# Patient Record
Sex: Male | Born: 1945 | Race: White | Hispanic: No | Marital: Married | Smoking: Former smoker
Health system: Southern US, Community
[De-identification: ages and names within clinical notes are randomized; demographics above are authoritative.]

## PROBLEM LIST (undated history)

## (undated) DIAGNOSIS — I10 Essential (primary) hypertension: Secondary | ICD-10-CM

## (undated) DIAGNOSIS — C801 Malignant (primary) neoplasm, unspecified: Secondary | ICD-10-CM

## (undated) DIAGNOSIS — M549 Dorsalgia, unspecified: Secondary | ICD-10-CM

## (undated) DIAGNOSIS — K219 Gastro-esophageal reflux disease without esophagitis: Secondary | ICD-10-CM

## (undated) DIAGNOSIS — I4891 Unspecified atrial fibrillation: Secondary | ICD-10-CM

## (undated) DIAGNOSIS — G629 Polyneuropathy, unspecified: Secondary | ICD-10-CM

## (undated) HISTORY — PX: TONSILLECTOMY: SUR1361

## (undated) HISTORY — PX: PROSTATE SURGERY: SHX751

---

## 2000-09-05 ENCOUNTER — Other Ambulatory Visit: Admission: RE | Admit: 2000-09-05 | Discharge: 2000-09-05 | Payer: Self-pay | Admitting: Urology

## 2001-05-13 ENCOUNTER — Encounter: Payer: Self-pay | Admitting: Urology

## 2001-05-20 ENCOUNTER — Inpatient Hospital Stay (HOSPITAL_COMMUNITY): Admission: RE | Admit: 2001-05-20 | Discharge: 2001-05-23 | Payer: Self-pay | Admitting: Urology

## 2004-09-15 ENCOUNTER — Emergency Department: Payer: Self-pay | Admitting: Unknown Physician Specialty

## 2004-09-15 ENCOUNTER — Other Ambulatory Visit: Payer: Self-pay

## 2005-05-22 ENCOUNTER — Ambulatory Visit: Payer: Self-pay | Admitting: Oncology

## 2005-05-25 ENCOUNTER — Ambulatory Visit: Payer: Self-pay | Admitting: Oncology

## 2005-05-25 ENCOUNTER — Other Ambulatory Visit: Payer: Self-pay

## 2005-05-25 ENCOUNTER — Emergency Department: Payer: Self-pay | Admitting: Internal Medicine

## 2005-06-17 ENCOUNTER — Ambulatory Visit: Payer: Self-pay | Admitting: Oncology

## 2005-07-17 ENCOUNTER — Ambulatory Visit: Payer: Self-pay | Admitting: Oncology

## 2005-11-30 ENCOUNTER — Ambulatory Visit: Payer: Self-pay | Admitting: Oncology

## 2005-12-17 ENCOUNTER — Ambulatory Visit: Payer: Self-pay | Admitting: Oncology

## 2006-06-18 ENCOUNTER — Ambulatory Visit: Payer: Self-pay | Admitting: Oncology

## 2006-06-20 ENCOUNTER — Ambulatory Visit: Payer: Self-pay | Admitting: Oncology

## 2006-07-18 ENCOUNTER — Ambulatory Visit: Payer: Self-pay | Admitting: Oncology

## 2007-01-18 ENCOUNTER — Ambulatory Visit: Payer: Self-pay | Admitting: Oncology

## 2007-01-24 ENCOUNTER — Ambulatory Visit: Payer: Self-pay | Admitting: Oncology

## 2007-02-17 ENCOUNTER — Ambulatory Visit: Payer: Self-pay | Admitting: Oncology

## 2007-07-18 ENCOUNTER — Ambulatory Visit: Payer: Self-pay | Admitting: Oncology

## 2007-07-23 ENCOUNTER — Ambulatory Visit: Payer: Self-pay | Admitting: Oncology

## 2007-08-18 ENCOUNTER — Ambulatory Visit: Payer: Self-pay | Admitting: Oncology

## 2008-08-17 ENCOUNTER — Ambulatory Visit: Payer: Self-pay | Admitting: Oncology

## 2008-08-23 ENCOUNTER — Ambulatory Visit: Payer: Self-pay | Admitting: Oncology

## 2008-09-16 ENCOUNTER — Ambulatory Visit: Payer: Self-pay | Admitting: Oncology

## 2009-02-16 ENCOUNTER — Ambulatory Visit: Payer: Self-pay | Admitting: Oncology

## 2009-02-23 ENCOUNTER — Ambulatory Visit: Payer: Self-pay | Admitting: Oncology

## 2009-03-19 ENCOUNTER — Ambulatory Visit: Payer: Self-pay | Admitting: Oncology

## 2009-10-17 ENCOUNTER — Ambulatory Visit: Payer: Self-pay | Admitting: Gastroenterology

## 2011-03-07 ENCOUNTER — Ambulatory Visit: Payer: Self-pay | Admitting: Family Medicine

## 2011-05-08 ENCOUNTER — Emergency Department: Payer: Self-pay | Admitting: *Deleted

## 2013-05-21 ENCOUNTER — Ambulatory Visit: Payer: Self-pay | Admitting: Otolaryngology

## 2014-04-04 ENCOUNTER — Emergency Department (HOSPITAL_COMMUNITY): Payer: BLUE CROSS/BLUE SHIELD

## 2014-04-04 ENCOUNTER — Emergency Department (HOSPITAL_COMMUNITY)
Admission: EM | Admit: 2014-04-04 | Discharge: 2014-04-04 | Disposition: A | Discharge status: Home / Self Care | Payer: BLUE CROSS/BLUE SHIELD | Attending: Emergency Medicine | Admitting: Emergency Medicine

## 2014-04-04 ENCOUNTER — Encounter (HOSPITAL_COMMUNITY): Payer: Self-pay

## 2014-04-04 DIAGNOSIS — R079 Chest pain, unspecified: Secondary | ICD-10-CM

## 2014-04-04 DIAGNOSIS — Z8546 Personal history of malignant neoplasm of prostate: Secondary | ICD-10-CM | POA: Diagnosis not present

## 2014-04-04 DIAGNOSIS — R0789 Other chest pain: Secondary | ICD-10-CM

## 2014-04-04 DIAGNOSIS — Z8669 Personal history of other diseases of the nervous system and sense organs: Secondary | ICD-10-CM | POA: Diagnosis not present

## 2014-04-04 DIAGNOSIS — Z8719 Personal history of other diseases of the digestive system: Secondary | ICD-10-CM | POA: Insufficient documentation

## 2014-04-04 DIAGNOSIS — I10 Essential (primary) hypertension: Secondary | ICD-10-CM | POA: Diagnosis not present

## 2014-04-04 DIAGNOSIS — Z87891 Personal history of nicotine dependence: Secondary | ICD-10-CM | POA: Diagnosis not present

## 2014-04-04 HISTORY — DX: Malignant (primary) neoplasm, unspecified: C80.1

## 2014-04-04 HISTORY — DX: Dorsalgia, unspecified: M54.9

## 2014-04-04 HISTORY — DX: Gastro-esophageal reflux disease without esophagitis: K21.9

## 2014-04-04 HISTORY — DX: Polyneuropathy, unspecified: G62.9

## 2014-04-04 HISTORY — DX: Unspecified atrial fibrillation: I48.91

## 2014-04-04 HISTORY — DX: Essential (primary) hypertension: I10

## 2014-04-04 LAB — BASIC METABOLIC PANEL
ANION GAP: 8 (ref 5–15)
BUN: 11 mg/dL (ref 6–23)
CO2: 28 mmol/L (ref 19–32)
CREATININE: 0.91 mg/dL (ref 0.50–1.35)
Calcium: 9.2 mg/dL (ref 8.4–10.5)
Chloride: 102 mEq/L (ref 96–112)
GFR calc non Af Amer: 86 mL/min — ABNORMAL LOW (ref >90)
GLUCOSE: 133 mg/dL — AB (ref 70–99)
POTASSIUM: 3.9 mmol/L (ref 3.5–5.1)
SODIUM: 138 mmol/L (ref 135–145)

## 2014-04-04 LAB — CBC
HCT: 41.7 % (ref 39.0–52.0)
Hemoglobin: 15.1 g/dL (ref 13.0–17.0)
MCH: 31.7 pg (ref 26.0–34.0)
MCHC: 36.2 g/dL — ABNORMAL HIGH (ref 30.0–36.0)
MCV: 87.6 fL (ref 78.0–100.0)
PLATELETS: 208 10*3/uL (ref 150–400)
RBC: 4.76 MIL/uL (ref 4.22–5.81)
RDW: 12.9 % (ref 11.5–15.5)
WBC: 7.7 10*3/uL (ref 4.0–10.5)

## 2014-04-04 LAB — I-STAT TROPONIN, ED: Troponin i, poc: 0 ng/mL (ref 0.00–0.08)

## 2014-04-04 MED ORDER — HYDROCODONE-ACETAMINOPHEN 5-325 MG PO TABS: 1.0000 | ORAL_TABLET | Freq: Four times a day (QID) | ORAL | Status: DC | PRN

## 2014-04-04 MED ORDER — HYDROCODONE-ACETAMINOPHEN 5-325 MG PO TABS
2.0000 | ORAL_TABLET | Freq: Once | ORAL | Status: AC
  Administered 2014-04-04: 1 via ORAL
  Filled 2014-04-04: qty 2

## 2014-04-04 NOTE — Discharge Instructions (Signed)
Chest Wall Pain Take Tylenol for mild pain or the pain medicine prescribed for bad pain. It is okay to use Biofreeze on the painful areas as well. Contact your primary care physician to arrange to be seen if not feeling better in 3 or 4 days. Return if your condition worsens for any reason Chest wall pain is pain in or around the bones and muscles of your chest. It may take up to 6 weeks to get better. It may take longer if you must stay physically active in your work and activities.  CAUSES  Chest wall pain may happen on its own. However, it may be caused by:  A viral illness like the flu.  Injury.  Coughing.  Exercise.  Arthritis.  Fibromyalgia.  Shingles. HOME CARE INSTRUCTIONS   Avoid overtiring physical activity. Try not to strain or perform activities that cause pain. This includes any activities using your chest or your abdominal and side muscles, especially if heavy weights are used.  Put ice on the sore area.  Put ice in a plastic bag.  Place a towel between your skin and the bag.  Leave the ice on for 15-20 minutes per hour while awake for the first 2 days.  Only take over-the-counter or prescription medicines for pain, discomfort, or fever as directed by your caregiver. SEEK IMMEDIATE MEDICAL CARE IF:   Your pain increases, or you are very uncomfortable.  You have a fever.  Your chest pain becomes worse.  You have new, unexplained symptoms.  You have nausea or vomiting.  You feel sweaty or lightheaded.  You have a cough with phlegm (sputum), or you cough up blood. MAKE SURE YOU:   Understand these instructions.  Will watch your condition.  Will get help right away if you are not doing well or get worse. Document Released: 03/05/2005 Document Revised: 05/28/2011 Document Reviewed: 10/30/2010 [facility] Patient Information 2015 [place], [place]. This information is not intended to replace advice given to you by your health care provider. Make sure you  discuss any questions you have with your health care provider.

## 2014-04-04 NOTE — ED Notes (Signed)
Pt. Reports midsternal chest pain radiation to back with slight nausea and SOB. States pain has been intermittent x2 days.

## 2014-04-04 NOTE — ED Notes (Signed)
Pt c/o centralized chest pain radiating into back starting two days ago. Pain is intermittent and increased with movement. Pt reports pain feels like a squeezing pain that "takes my breath." Pt denies chest pain at this time; reports feeling "bruised and sore." Denies recent injury to chest area

## 2014-04-04 NOTE — ED Provider Notes (Signed)
CSN: [id number]     Arrival date & time 04/04/14  2028 History   First MD Initiated Contact with Patient 04/04/14 2138     Chief Complaint  Patient presents with  . Chest Pain     (Consider location/radiation/quality/duration/timing/severity/associated sxs/prior Treatment) HPI Complains of anterior chest pain rating to the back worse caused by changing positions onset 2 days ago he only has pain with changing positions he has no pain with remaining still. Discomfort is nonexertional. He denies shortness of breath denies nausea. Pain feels like a muscle pull no other associated symptoms. No fever no cough no shortness of breath no other associated symptoms no treatment prior to coming here. Pain is severe with changing positions no pain with remaining still. His wife treated him with topical Biofreeze with relief . Past Medical History  Diagnosis Date  . Hypertension   . Cancer     prostate  . A-fib   . GERD (gastroesophageal reflux disease)   . Back pain   . Neuropathy    prostate cancer greater than 10 years ago Past Surgical History  Procedure Laterality Date  . Prostate surgery    . Tonsillectomy      No family history on file. History  Substance Use Topics  . Smoking status: Former Research scientist (life sciences)  . Smokeless tobacco: Not on file  . Alcohol Use: No    Review of Systems  Constitutional: Negative.   HENT: Negative.   Respiratory: Negative.   Cardiovascular: Positive for chest pain.  Gastrointestinal: Negative.   Musculoskeletal: Negative.   Skin: Negative.   Neurological: Negative.   Psychiatric/Behavioral: Negative.   All other systems reviewed and are negative.     Allergies  Contrast media; Sulfa antibiotics; and Tetracyclines & related  Home Medications   Prior to Admission medications   Not on File   BP 128/87 mmHg  Pulse 74  Temp(Src) 97.6 F (36.4 C) (Oral)  Resp 18  SpO2 98% Physical Exam  ED Course  Procedures (including critical care time) Labs  Review Labs Reviewed  CBC - Abnormal; Notable for the following:    MCHC 36.2 (*)    All other components within normal limits  BASIC METABOLIC PANEL - Abnormal; Notable for the following:    Glucose, Bld 133 (*)    GFR calc non Af Amer 86 (*)    All other components within normal limits  I-STAT TROPOININ, ED    Imaging Review No results found.   EKG Interpretation   Date/Time:  Sunday April 04 2014 20:40:00 EST Ventricular Rate:  87 PR Interval:  152 QRS Duration: 88 QT Interval:  394 QTC Calculation: 474 R Axis:   62 Text Interpretation:  Normal sinus rhythm Normal ECG No significant change  since last tracing Confirmed by Winfred Leeds  MD, Ameliah Baskins [phone]) on 04/04/2014  9:44:23 PM     chest x-ray viewed by me  Results for orders placed or performed during the hospital encounter of 04/04/14  CBC  Result Value Ref Range   WBC 7.7 4.0 - 10.5 K/uL   RBC 4.76 4.22 - 5.81 MIL/uL   Hemoglobin 15.1 13.0 - 17.0 g/dL   HCT 41.7 39.0 - 52.0 %   MCV 87.6 78.0 - 100.0 fL   MCH 31.7 26.0 - 34.0 pg   MCHC 36.2 (H) 30.0 - 36.0 g/dL   RDW 12.9 11.5 - 15.5 %   Platelets 208 150 - 400 K/uL  Basic metabolic panel  Result Value Ref Range   Sodium  138 135 - 145 mmol/L   Potassium 3.9 3.5 - 5.1 mmol/L   Chloride 102 96 - 112 mEq/L   CO2 28 19 - 32 mmol/L   Glucose, Bld 133 (H) 70 - 99 mg/dL   BUN 11 6 - 23 mg/dL   Creatinine, Ser 0.91 0.50 - 1.35 mg/dL   Calcium 9.2 8.4 - 10.5 mg/dL   GFR calc non Af Amer 86 (L) >90 mL/min   GFR calc Af Amer >90 >90 mL/min   Anion gap 8 5 - 15  I-stat troponin, ED (not at [facility])  Result Value Ref Range   Troponin i, poc 0.00 0.00 - 0.08 ng/mL   Comment 3           Dg Chest 2 View  04/04/2014   CLINICAL DATA:  Centralized chest pain described as tightness. History of atrial fibrillation and hypertension.  EXAM: CHEST  2 VIEW  COMPARISON:  None.  FINDINGS: Cardiomediastinal silhouette is unremarkable. Strandy densities LEFT lung base. The lungs are  otherwise clear without pleural effusions or focal consolidations. Trachea projects midline and there is no pneumothorax. Soft tissue planes and included osseous structures are non-suspicious. Moderate degenerative change of the thoracic spine.  IMPRESSION: LEFT lung base atelectasis/ scarring.   Electronically Signed   By: Elon Alas   On: 04/04/2014 21:45    MDM  Current risk factors hypercholesterolemia, hypertension. Heart score equals 3 based on age and risk factors Final diagnoses:  Chest pain   Strongly doubt acute coronary syndrome with normal EKG, normal troponin, atypical story. Strongly doubt pulmonary embolism. No shortness of breath Pain is most consistent with musculoskeletal chest pain Plan prescription Norco follow-up with primary care physician if significant pain in 3 or 4 days Diagnosis chest wall pain    [name], MD 04/04/14 2221

## 2014-07-28 ENCOUNTER — Other Ambulatory Visit: Payer: Self-pay | Admitting: Dermatology

## 2019-12-03 LAB — EXTERNAL GENERIC LAB PROCEDURE: COLOGUARD: NEGATIVE

## 2020-05-06 ENCOUNTER — Observation Stay (HOSPITAL_COMMUNITY)
Admission: EM | Admit: 2020-05-06 | Discharge: 2020-05-07 | Disposition: A | Discharge status: Home / Self Care | Payer: Medicare Other | Attending: Student | Admitting: Student

## 2020-05-06 ENCOUNTER — Encounter (HOSPITAL_COMMUNITY): Payer: Self-pay | Admitting: *Deleted

## 2020-05-06 ENCOUNTER — Emergency Department (HOSPITAL_COMMUNITY): Payer: Medicare Other

## 2020-05-06 ENCOUNTER — Other Ambulatory Visit: Payer: Self-pay

## 2020-05-06 DIAGNOSIS — I639 Cerebral infarction, unspecified: Secondary | ICD-10-CM

## 2020-05-06 DIAGNOSIS — Z8546 Personal history of malignant neoplasm of prostate: Secondary | ICD-10-CM | POA: Insufficient documentation

## 2020-05-06 DIAGNOSIS — R4781 Slurred speech: Secondary | ICD-10-CM | POA: Diagnosis present

## 2020-05-06 DIAGNOSIS — Z79899 Other long term (current) drug therapy: Secondary | ICD-10-CM | POA: Insufficient documentation

## 2020-05-06 DIAGNOSIS — R2689 Other abnormalities of gait and mobility: Secondary | ICD-10-CM | POA: Diagnosis not present

## 2020-05-06 DIAGNOSIS — G459 Transient cerebral ischemic attack, unspecified: Secondary | ICD-10-CM | POA: Diagnosis not present

## 2020-05-06 DIAGNOSIS — Z20822 Contact with and (suspected) exposure to covid-19: Secondary | ICD-10-CM | POA: Diagnosis not present

## 2020-05-06 DIAGNOSIS — I10 Essential (primary) hypertension: Secondary | ICD-10-CM | POA: Insufficient documentation

## 2020-05-06 DIAGNOSIS — E785 Hyperlipidemia, unspecified: Secondary | ICD-10-CM

## 2020-05-06 DIAGNOSIS — Z87891 Personal history of nicotine dependence: Secondary | ICD-10-CM | POA: Insufficient documentation

## 2020-05-06 DIAGNOSIS — R4701 Aphasia: Secondary | ICD-10-CM | POA: Diagnosis present

## 2020-05-06 DIAGNOSIS — E782 Mixed hyperlipidemia: Secondary | ICD-10-CM

## 2020-05-06 HISTORY — DX: Cerebral infarction, unspecified: I63.9

## 2020-05-06 HISTORY — DX: Transient cerebral ischemic attack, unspecified: G45.9

## 2020-05-06 LAB — APTT: aPTT: 41 seconds — ABNORMAL HIGH (ref 24–36)

## 2020-05-06 LAB — DIFFERENTIAL
Abs Immature Granulocytes: 0.01 10*3/uL (ref 0.00–0.07)
Basophils Absolute: 0.1 10*3/uL (ref 0.0–0.1)
Basophils Relative: 1 %
Eosinophils Absolute: 0.4 10*3/uL (ref 0.0–0.5)
Eosinophils Relative: 5 %
Immature Granulocytes: 0 %
Lymphocytes Relative: 34 %
Lymphs Abs: 3.1 10*3/uL (ref 0.7–4.0)
Monocytes Absolute: 0.8 10*3/uL (ref 0.1–1.0)
Monocytes Relative: 8 %
Neutro Abs: 4.7 10*3/uL (ref 1.7–7.7)
Neutrophils Relative %: 52 %

## 2020-05-06 LAB — COMPREHENSIVE METABOLIC PANEL
ALT: 77 U/L — ABNORMAL HIGH (ref 0–44)
AST: 94 U/L — ABNORMAL HIGH (ref 15–41)
Albumin: 4.7 g/dL (ref 3.5–5.0)
Alkaline Phosphatase: 75 U/L (ref 38–126)
Anion gap: 10 (ref 5–15)
BUN: 14 mg/dL (ref 8–23)
CO2: 28 mmol/L (ref 22–32)
Calcium: 9.6 mg/dL (ref 8.9–10.3)
Chloride: 101 mmol/L (ref 98–111)
Creatinine, Ser: 0.88 mg/dL (ref 0.61–1.24)
GFR, Estimated: >60 mL/min (ref >60)
Glucose, Bld: 110 mg/dL — ABNORMAL HIGH (ref 70–99)
Potassium: 3.8 mmol/L (ref 3.5–5.1)
Sodium: 139 mmol/L (ref 135–145)
Total Bilirubin: 1.2 mg/dL (ref 0.3–1.2)
Total Protein: 8 g/dL (ref 6.5–8.1)

## 2020-05-06 LAB — RAPID URINE DRUG SCREEN, HOSP PERFORMED
Amphetamines: NOT DETECTED
Barbiturates: NOT DETECTED
Benzodiazepines: NOT DETECTED
Cocaine: NOT DETECTED
Opiates: NOT DETECTED
Tetrahydrocannabinol: NOT DETECTED

## 2020-05-06 LAB — CBC
HCT: 42.5 % (ref 39.0–52.0)
Hemoglobin: 14.9 g/dL (ref 13.0–17.0)
MCH: 32.3 pg (ref 26.0–34.0)
MCHC: 35.1 g/dL (ref 30.0–36.0)
MCV: 92.2 fL (ref 80.0–100.0)
Platelets: 217 10*3/uL (ref 150–400)
RBC: 4.61 MIL/uL (ref 4.22–5.81)
RDW: 12.6 % (ref 11.5–15.5)
WBC: 9.1 10*3/uL (ref 4.0–10.5)
nRBC: 0 % (ref 0.0–0.2)

## 2020-05-06 LAB — PROTIME-INR
INR: 1.1 (ref 0.8–1.2)
Prothrombin Time: 13.7 seconds (ref 11.4–15.2)

## 2020-05-06 LAB — URINALYSIS, ROUTINE W REFLEX MICROSCOPIC
Bilirubin Urine: NEGATIVE
Glucose, UA: NEGATIVE mg/dL
Hgb urine dipstick: NEGATIVE
Ketones, ur: NEGATIVE mg/dL
Nitrite: POSITIVE — AB
Protein, ur: NEGATIVE mg/dL
Specific Gravity, Urine: 1.009 (ref 1.005–1.030)
WBC, UA: >50 WBC/hpf — ABNORMAL HIGH (ref 0–5)
pH: 6 (ref 5.0–8.0)

## 2020-05-06 LAB — I-STAT CHEM 8, ED
BUN: 15 mg/dL (ref 8–23)
Calcium, Ion: 1.21 mmol/L (ref 1.15–1.40)
Chloride: 101 mmol/L (ref 98–111)
Creatinine, Ser: 0.8 mg/dL (ref 0.61–1.24)
Glucose, Bld: 108 mg/dL — ABNORMAL HIGH (ref 70–99)
HCT: 43 % (ref 39.0–52.0)
Hemoglobin: 14.6 g/dL (ref 13.0–17.0)
Potassium: 3.8 mmol/L (ref 3.5–5.1)
Sodium: 140 mmol/L (ref 135–145)
TCO2: 28 mmol/L (ref 22–32)

## 2020-05-06 LAB — ETHANOL: Alcohol, Ethyl (B): <10 mg/dL (ref <10)

## 2020-05-06 LAB — RESP PANEL BY RT-PCR (FLU A&B, COVID) ARPGX2
Influenza A by PCR: NEGATIVE
Influenza B by PCR: NEGATIVE
SARS Coronavirus 2 by RT PCR: NEGATIVE

## 2020-05-06 MED ORDER — ATORVASTATIN CALCIUM 10 MG PO TABS
20.0000 mg | ORAL_TABLET | Freq: Every evening | ORAL | Status: DC
Start: 2020-05-07 — End: 2020-05-07

## 2020-05-06 MED ORDER — ACETAMINOPHEN 160 MG/5ML PO SOLN: 650.0000 mg | ORAL | Status: DC | PRN

## 2020-05-06 MED ORDER — ASPIRIN 81 MG PO CHEW
81.0000 mg | CHEWABLE_TABLET | Freq: Every day | ORAL | Status: DC
  Administered 2020-05-07 (×2): 81 mg via ORAL
  Filled 2020-05-06 (×2): qty 1

## 2020-05-06 MED ORDER — ACETAMINOPHEN 650 MG RE SUPP: 650.0000 mg | RECTAL | Status: DC | PRN

## 2020-05-06 MED ORDER — SODIUM CHLORIDE 0.9 % IV BOLUS
500.0000 mL | Freq: Once | INTRAVENOUS | Status: AC
  Administered 2020-05-06: 500 mL via INTRAVENOUS

## 2020-05-06 MED ORDER — ACETAMINOPHEN 325 MG PO TABS: 650.0000 mg | ORAL_TABLET | ORAL | Status: DC | PRN

## 2020-05-06 MED ORDER — SENNOSIDES-DOCUSATE SODIUM 8.6-50 MG PO TABS: 1.0000 | ORAL_TABLET | Freq: Every evening | ORAL | Status: DC | PRN

## 2020-05-06 MED ORDER — SODIUM CHLORIDE 0.9 % IV SOLN
1.0000 g | Freq: Once | INTRAVENOUS | Status: AC
  Administered 2020-05-06: 1 g via INTRAVENOUS
  Filled 2020-05-06: qty 10

## 2020-05-06 MED ORDER — LORAZEPAM 1 MG PO TABS
1.0000 mg | ORAL_TABLET | Freq: Once | ORAL | Status: DC | PRN
  Filled 2020-05-06: qty 1

## 2020-05-06 MED ORDER — ENOXAPARIN SODIUM 40 MG/0.4ML ~~LOC~~ SOLN
40.0000 mg | SUBCUTANEOUS | Status: DC
  Administered 2020-05-07: 40 mg via SUBCUTANEOUS
  Filled 2020-05-06: qty 0.4

## 2020-05-06 MED ORDER — STROKE: EARLY STAGES OF RECOVERY BOOK
Freq: Once | Status: DC
  Filled 2020-05-06: qty 1

## 2020-05-06 NOTE — ED Provider Notes (Signed)
[facility] DEPT Provider Note   CSN: [id number] Arrival date & time: 05/06/20  1813     History Chief Complaint  Patient presents with  . evaluation for ? TIA vs CVA yesterday    Matthew Phelps is a 75 y.o. male history of paroxysmal A. fib not on anticoagulants, GERD, hypertension, neuropathy, prostate cancer, hyperlipidemia.  Patient presents today for evaluation of slurred speech that occurred yesterday.  Around 4 PM yesterday patient reports that he was at the hairdresser when he had a 5-minute episode of difficulty speaking he reports that he cannot find the words to say and when he tried to talk they came out slurred, this gradually resolved and has not reoccurred since around 4 PM yesterday.  Patient reports that this episode of slurred speech occurred shortly after he had been getting his hair washed and was leaning back.  Denies recent illness, fever/chills, headache, vision changes, facial droop, unilateral weakness, numbness/tingling, chest pain, abdominal pain, nausea/vomiting or any additional concerns  HPI     Past Medical History:  Diagnosis Date  . A-fib ([place])   . Back pain   . Cancer [facility])    prostate  . GERD (gastroesophageal reflux disease)   . Hypertension   . Neuropathy     There are no problems to display for this patient.   Past Surgical History:  Procedure Laterality Date  . PROSTATE SURGERY    . TONSILLECTOMY         No family history on file.  Social History   Tobacco Use  . Smoking status: Former Research scientist (life sciences)  . Smokeless tobacco: Never Used  Substance Use Topics  . Alcohol use: No  . Drug use: No    Home Medications Prior to Admission medications   Medication Sig Start Date End Date Taking? Authorizing Provider  atorvastatin (LIPITOR) 20 MG tablet Take 20 mg by mouth every evening. 06/18/19  Yes [provider]  cholecalciferol (VITAMIN D3) 25 MCG (1000 UNIT) tablet Take 1,000 Units by mouth  daily.   Yes [provider]  Multiple Vitamins-Minerals (MULTIVITAMIN ADULTS 50+) TABS Take 1 tablet by mouth daily.   Yes [provider]  vitamin C (ASCORBIC ACID) 500 MG tablet Take 500 mg by mouth daily.   Yes [provider]  HYDROcodone-acetaminophen (NORCO) 5-325 MG per tablet Take 1-2 tablets by mouth every 6 (six) hours as needed. Patient not taking: Reported on 05/06/2020 04/04/14   [name], MD    Allergies    Contrast media [iodinated diagnostic agents], Sulfa antibiotics, and Tetracyclines & related  Review of Systems   Review of Systems Ten systems are reviewed and are negative for acute change except as noted in the HPI  Physical Exam Updated Vital Signs BP 128/85   Pulse 75   Temp 97.6 F (36.4 C) (Oral)   Resp 15   Ht 6\' 4"  (1.93 m)   Wt 93 kg   SpO2 97%   BMI 24.95 kg/m   Physical Exam Constitutional:      General: He is not in acute distress.    Appearance: Normal appearance. He is well-developed. He is not ill-appearing or diaphoretic.  HENT:     Head: Normocephalic and atraumatic.  Eyes:     General: Vision grossly intact. Gaze aligned appropriately.     Pupils: Pupils are equal, round, and reactive to light.  Neck:     Trachea: Trachea and phonation normal.  Pulmonary:     Effort: Pulmonary  effort is normal. No respiratory distress.  Abdominal:     General: There is no distension.     Palpations: Abdomen is soft.     Tenderness: There is no abdominal tenderness. There is no guarding or rebound.  Musculoskeletal:        General: Normal range of motion.     Cervical back: Normal range of motion.  Skin:    General: Skin is warm and dry.  Neurological:     Mental Status: He is alert.     GCS: GCS eye subscore is 4. GCS verbal subscore is 5. GCS motor subscore is 6.     Comments: Speech is clear and goal oriented, follows commands Major Cranial nerves without deficit, no facial droop Normal strength in upper and  lower extremities bilaterally including dorsiflexion and plantar flexion, strong and equal grip strength Sensation normal to light and sharp touch Moves extremities without ataxia, coordination intact Normal finger to nose and rapid alternating movements Neg romberg, no pronator drift Normal gait Normal heel-shin and balance  Psychiatric:        Behavior: Behavior normal.     ED Results / Procedures / Treatments   Labs (all labs ordered are listed, but only abnormal results are displayed) Labs Reviewed  APTT - Abnormal; Notable for the following components:      Result Value   aPTT 41 (*)    All other components within normal limits  COMPREHENSIVE METABOLIC PANEL - Abnormal; Notable for the following components:   Glucose, Bld 110 (*)    AST 94 (*)    ALT 77 (*)    All other components within normal limits  URINALYSIS, ROUTINE W REFLEX MICROSCOPIC - Abnormal; Notable for the following components:   APPearance HAZY (*)    Nitrite POSITIVE (*)    Leukocytes,Ua LARGE (*)    WBC, UA >50 (*)    Bacteria, UA MANY (*)    All other components within normal limits  I-STAT CHEM 8, ED - Abnormal; Notable for the following components:   Glucose, Bld 108 (*)    All other components within normal limits  URINE CULTURE  RESP PANEL BY RT-PCR (FLU A&B, COVID) ARPGX2  PROTIME-INR  CBC  DIFFERENTIAL  RAPID URINE DRUG SCREEN, HOSP PERFORMED  ETHANOL    EKG EKG Interpretation  Date/Time:  Friday May 06 2020 19:07:07 EST Ventricular Rate:  74 PR Interval:    QRS Duration: 97 QT Interval:  414 QTC Calculation: 460 R Axis:   69 Text Interpretation: Sinus rhythm Confirmed by Lennice Sites [phone]) on 05/06/2020 9:48:36 PM   Radiology CT HEAD WO CONTRAST  Result Date: 05/06/2020 CLINICAL DATA:  Recent episode of slurred speech and word-finding difficulty. EXAM: CT HEAD WITHOUT CONTRAST TECHNIQUE: Contiguous axial images were obtained from the base of the skull through the vertex  without intravenous contrast. COMPARISON:  Head CT 07/03/2006 FINDINGS: Brain: There is no mass, hemorrhage or extra-axial collection. The size and configuration of the ventricles and extra-axial CSF spaces are normal. The brain parenchyma is normal, without acute or chronic infarction. Vascular: No abnormal hyperdensity of the major intracranial arteries or dural venous sinuses. No intracranial atherosclerosis. Skull: The visualized skull base, calvarium and extracranial soft tissues are normal. Sinuses/Orbits: No fluid levels or advanced mucosal thickening of the visualized paranasal sinuses. No mastoid or middle ear effusion. The orbits are normal. IMPRESSION: Normal head CT. Electronically Signed   By: Ulyses Jarred M.D.   On: 05/06/2020 22:09  Procedures Procedures   Medications Ordered in ED Medications  cefTRIAXone (ROCEPHIN) 1 g in sodium chloride 0.9 % 100 mL IVPB (1 g Intravenous New Bag/Given 05/06/20 2215)  sodium chloride 0.9 % bolus 500 mL (500 mLs Intravenous New Bag/Given 05/06/20 2209)    ED Course  I have reviewed the triage vital signs and the nursing notes.  Pertinent labs & imaging results that were available during my care of the patient were reviewed by me and considered in my medical decision making (see chart for details).    MDM Rules/Calculators/A&P                         Additional history obtained from: 1. Nursing notes from this visit. 2. EMR review. ------------- I ordered, reviewed and interpreted labs which include: CBC shows no leukocytosis to suggest infection, no anemia. CMP shows mild elevations of AST and ALT, no priors to compare.  No emergent electrolyte derangement, AKI or gap. PT/INR within normal limits. Ethanol negative. Urinalysis shows no evidence for UTI, urine culture pending.  IV Rocephin ordered for treatment UDS negative.  EKG: Sinus rhythm Confirmed by Lennice Sites [phone]) on 05/06/2020 9:48:36 PM  CT Head:  IMPRESSION:  Normal  head CT.  - 10:20 PM: Consult with neurologist Dr. Leonel Ramsay who advises medicine admission at [facility] for TIA work-up. - Patient reassessed he is resting comfortably in bed no acute distress wife at bedside.  Patient updated on findings above and he states understanding, patient does report that has been experiencing some mild dysuria for the past few weeks and is actually scheduled to see his urologist on the 24th of this month.  Doubt patient's UTI caused his aphasia yesterday.  Patient and his wife are agreeable for admission - 10:40 PM: Consult with Dr. Posey Pronto, patient has been accepted to hospitalist service.    Note: Portions of this report may have been transcribed using voice recognition software. Every effort was made to ensure accuracy; however, inadvertent computerized transcription errors may still be present. Final Clinical Impression(s) / ED Diagnoses Final diagnoses:  TIA (transient ischemic attack)    Rx / DC Orders ED Discharge Orders    None       Gari Crown 05/06/20 2243    Lennice Sites, DO 05/06/20 2331

## 2020-05-06 NOTE — ED Triage Notes (Signed)
Yesterday afternoon pt had a brief episode of slurred speech and forming words, no ext weakness.  All symptoms have resolved, family member wants him checked out.

## 2020-05-06 NOTE — H&P (Signed)
History and Physical    Matthew Phelps:[id number] DOB: 16-May-1945 DOA: 05/06/2020  PCP: Ellene Route  Patient coming from: Home  I have personally briefly reviewed patient's old medical records in [place]  Chief Complaint: Transient aphasia  HPI: Matthew Phelps is a 75 y.o. male with medical history significant for remote paroxysmal atrial fibrillation, hyperlipidemia, prostate cancer s/p prostatectomy who presents to the ED for evaluation of transient aphasic episode.  Patient states he was in his usual state of health until around 4 PM on 05/05/2020 while he was at the hairdresser when he had about a 5-minute transient episode of difficulty speaking.  He says he knew what he wanted to say but the speech was very difficult to produce and slurred.  He says his hairdresser did not notice any facial droop.  He did not have any associated headache, change in vision, chest pain, dyspnea, nausea, vomiting, numbness/tingling, or new weakness in his hands, arms, or legs.  He denies any similar episode in the past.  He does report some recent burning with urination and increased urinary frequency.  Patient states only medication he currently takes his atorvastatin.  He reports a remote history of atrial fibrillation years ago at which time he was admitted and chemically converted to normal sinus rhythm.  He has not had a documented recurrence of atrial fibrillation but does report rare palpitations.  He reports a history of stroke in his mother.  ED Course:  Initial vitals showed BP 156/94, pulse 79, RR 16, temp 97.6 F, SPO2 99% on room air.  Labs show WBC 9.1, hemoglobin 14.9, platelets 217,000, sodium 139, potassium 3.8, bicarb 28, BUN 14, creatinine 0.88, serum glucose 110, AST 94, ALT 77, alk phos 75, total bilirubin 1.2, serum ethanol <10.  UDS negative.  Urinalysis shows positive nitrates, large leukocytes, 0-5 RBC/hpf, >50 WBC/hpf, many bacteria on microscopy.   Urine culture obtained and pending.  SARS-CoV-2 PCR negative.  Influenza A/B PCR is negative.  CT head without contrast is negative.  Patient was given 500 cc normal saline and IV ceftriaxone.  EDP discussed with on-call neurology who recommended medical admission to [facility] for further TIA work-up.  The hospitalist service was consulted to admit for further evaluation and management.   Review of Systems: All systems reviewed and are negative except as documented in history of present illness above.   Past Medical History:  Diagnosis Date  . A-fib ([place])   . Back pain   . Cancer [facility])    prostate  . GERD (gastroesophageal reflux disease)   . Hypertension   . Neuropathy     Past Surgical History:  Procedure Laterality Date  . PROSTATE SURGERY    . TONSILLECTOMY      Social History:  reports that he has quit smoking. He has never used smokeless tobacco. He reports that he does not drink alcohol and does not use drugs.  Allergies  Allergen Reactions  . Contrast Media [Iodinated Diagnostic Agents]   . Sulfa Antibiotics   . Tetracyclines & Related     Family History  Problem Relation Age of Onset  . Stroke Mother   . Hypertension Mother   . Prostate cancer Father      Prior to Admission medications   Medication Sig Start Date End Date Taking? Authorizing Provider  atorvastatin (LIPITOR) 20 MG tablet Take 20 mg by mouth every evening. 06/18/19  Yes [provider]  cholecalciferol (VITAMIN D3) 25 MCG (1000 UNIT)  tablet Take 1,000 Units by mouth daily.   Yes [provider]  Multiple Vitamins-Minerals (MULTIVITAMIN ADULTS 50+) TABS Take 1 tablet by mouth daily.   Yes [provider]  vitamin C (ASCORBIC ACID) 500 MG tablet Take 500 mg by mouth daily.   Yes [provider]  HYDROcodone-acetaminophen (NORCO) 5-325 MG per tablet Take 1-2 tablets by mouth every 6 (six) hours as needed. Patient not taking: Reported on 05/06/2020  04/04/14   [name], MD    Physical Exam: Vitals:   05/06/20 2130 05/06/20 2200 05/06/20 2230 05/06/20 2300  BP: (!) 139/100 128/85 132/82 135/86  Pulse: 88 75 65 76  Resp: 17 15 15 18   Temp:      TempSrc:      SpO2: 96% 97% 96% 96%  Weight:      Height:       Constitutional: Resting supine in bed, NAD, calm, comfortable Eyes: PERRL, lids and conjunctivae normal ENMT: Mucous membranes are moist. Posterior pharynx clear of any exudate or lesions.Normal dentition.  Neck: normal, supple, no masses. Respiratory: clear to auscultation bilaterally, no wheezing, no crackles. Normal respiratory effort. No accessory muscle use.  Cardiovascular: Regular rate and rhythm, no murmurs / rubs / gallops. No extremity edema. 2+ pedal pulses. Abdomen: no tenderness, no masses palpated. No hepatosplenomegaly. Bowel sounds positive.  Musculoskeletal: no clubbing / cyanosis. No joint deformity upper and lower extremities. Good ROM, no contractures. Normal muscle tone.  Skin: no rashes, lesions, ulcers. No induration Neurologic: CN 2-12 grossly intact. Sensation intact. Strength 5/5 in all 4.  Psychiatric: Normal judgment and insight. Alert and oriented x 3. Normal mood.   Labs on Admission: I have personally reviewed following labs and imaging studies  CBC: Recent Labs  Lab 05/06/20 1930 05/06/20 2006  WBC 9.1  --   NEUTROABS 4.7  --   HGB 14.9 14.6  HCT 42.5 43.0  MCV 92.2  --   PLT 217  --    Basic Metabolic Panel: Recent Labs  Lab 05/06/20 1930 05/06/20 2006  NA 139 140  K 3.8 3.8  CL 101 101  CO2 28  --   GLUCOSE 110* 108*  BUN 14 15  CREATININE 0.88 0.80  CALCIUM 9.6  --    GFR: Estimated Creatinine Clearance: 102.5 mL/min (by C-G formula based on SCr of 0.8 mg/dL). Liver Function Tests: Recent Labs  Lab 05/06/20 1930  AST 94*  ALT 77*  ALKPHOS 75  BILITOT 1.2  PROT 8.0  ALBUMIN 4.7   No results for input(s): LIPASE, AMYLASE in the last 168 hours. No results  for input(s): AMMONIA in the last 168 hours. Coagulation Profile: Recent Labs  Lab 05/06/20 1930  INR 1.1   Cardiac Enzymes: No results for input(s): CKTOTAL, CKMB, CKMBINDEX, TROPONINI in the last 168 hours. BNP (last 3 results) No results for input(s): PROBNP in the last 8760 hours. HbA1C: No results for input(s): HGBA1C in the last 72 hours. CBG: No results for input(s): GLUCAP in the last 168 hours. Lipid Profile: No results for input(s): CHOL, HDL, LDLCALC, TRIG, CHOLHDL, LDLDIRECT in the last 72 hours. Thyroid Function Tests: No results for input(s): TSH, T4TOTAL, FREET4, T3FREE, THYROIDAB in the last 72 hours. Anemia Panel: No results for input(s): VITAMINB12, FOLATE, FERRITIN, TIBC, IRON, RETICCTPCT in the last 72 hours. Urine analysis:    Component Value Date/Time   COLORURINE YELLOW 05/06/2020 2018   APPEARANCEUR HAZY (A) 05/06/2020 2018   LABSPEC 1.009 05/06/2020 2018   PHURINE  6.0 05/06/2020 2018   GLUCOSEU NEGATIVE 05/06/2020 2018   HGBUR NEGATIVE 05/06/2020 2018   BILIRUBINUR NEGATIVE 05/06/2020 2018   KETONESUR NEGATIVE 05/06/2020 2018   PROTEINUR NEGATIVE 05/06/2020 2018   NITRITE POSITIVE (A) 05/06/2020 2018   LEUKOCYTESUR LARGE (A) 05/06/2020 2018    Radiological Exams on Admission: CT HEAD WO CONTRAST  Result Date: 05/06/2020 CLINICAL DATA:  Recent episode of slurred speech and word-finding difficulty. EXAM: CT HEAD WITHOUT CONTRAST TECHNIQUE: Contiguous axial images were obtained from the base of the skull through the vertex without intravenous contrast. COMPARISON:  Head CT 07/03/2006 FINDINGS: Brain: There is no mass, hemorrhage or extra-axial collection. The size and configuration of the ventricles and extra-axial CSF spaces are normal. The brain parenchyma is normal, without acute or chronic infarction. Vascular: No abnormal hyperdensity of the major intracranial arteries or dural venous sinuses. No intracranial atherosclerosis. Skull: The visualized  skull base, calvarium and extracranial soft tissues are normal. Sinuses/Orbits: No fluid levels or advanced mucosal thickening of the visualized paranasal sinuses. No mastoid or middle ear effusion. The orbits are normal. IMPRESSION: Normal head CT. Electronically Signed   By: Ulyses Jarred M.D.   On: 05/06/2020 22:09    EKG: Personally reviewed. Normal sinus rhythm without acute ischemic changes, motion artifact present.  Not significantly changed when compared to prior.  Assessment/Plan Principal Problem:   Aphasia Active Problems:   Hyperlipidemia  AMANDO CHAPUT is a 75 y.o. male with medical history significant for remote paroxysmal atrial fibrillation, hyperlipidemia, prostate cancer s/p prostatectomy who is admitted with transient aphasic/dysarthric episode for TIA work-up.  Transient aphasia: Approximately 5 minute episode of aphasia/dysarthria on 05/05/2020 without other focal deficits.  Symptoms now completely resolved.  Admitting to [facility] for [facility]IA work-up. -Obtain MRI brain without contrast -Obtain echocardiogram and carotid Dopplers -Monitor on telemetry, continue neurochecks -Start aspirin 81 mg daily -Continue atorvastatin -Check A1c, lipid panel -PT/OT/SLP eval -Neurology to see  Remote history of paroxysmal atrial fibrillation: States he was admitted and converted normal sinus rhythm without need for cardioversion at that time years ago.  No documented recurrence of A. fib.  Continue monitor on telemetry.  UTI: Patient with urinary symptoms and urinalysis consistent with UTI. -Continue IV ceftriaxone -Follow urine culture  Hyperlipidemia: Continue atorvastatin.  Elevated LFTs: Mildly elevated and similar to baseline labs as seen in Care Everywhere.  Continue to monitor while on statin.  DVT prophylaxis: Lovenox Code Status: Full code, confirmed with patient Family Communication: Discussed with patient's wife at bedside Disposition Plan: From home and  likely discharge to home pending further CVA work-up Consults called: Neurology Level of care: Telemetry Medical Admission status:  Status is: Observation  The patient remains OBS appropriate and will d/c before 2 midnights.  Dispo: The patient is from: Home              Anticipated d/c is to: Home              Anticipated d/c date is: 1 day              Patient currently is not medically stable to d/c.   Difficult to place patient No   Zada Finders MD [facility]  If 7PM-7AM, please contact night-coverage [email or web address]  05/06/2020, 11:51 PM

## 2020-05-06 NOTE — ED Provider Notes (Signed)
I personally evaluated the patient during the encounter and completed a history, physical, procedures, medical decision making to contribute to the overall care of the patient and decision making for the patient briefly, the patient is a 75 y.o. male with history of paroxysmal A. fib, hypertension who presents to the ED with TIA symptoms.  Patient had about 5 minutes of aphasia yesterday.  States that he had difficulty getting out words that he wanted to get out.  This happened while at a barbershop.  Overall has felt well since but after talking with his doctor they were concerned about a mini stroke as well.  Patient neurologically intact.  History seems consistent with a TIA.  He did say that symptoms happen several minutes after going from a lying to a sitting position but still seems that he would need stroke work-up.  Urinalysis was consistent with infection and will treat for UTI but doubt that caused the symptoms yesterday.  CT is normal.  Anticipate admission for TIA work-up.  Will consult neurology.  This chart was dictated using voice recognition software.  Despite best efforts to proofread,  errors can occur which can change the documentation meaning.     EKG Interpretation  Date/Time:  Friday May 06 2020 19:07:07 EST Ventricular Rate:  74 PR Interval:    QRS Duration: 97 QT Interval:  414 QTC Calculation: 460 R Axis:   69 Text Interpretation: Sinus rhythm Confirmed by Lennice Sites [phone]) on 05/06/2020 9:48:36 PM           Lennice Sites, DO 05/06/20 2216

## 2020-05-07 ENCOUNTER — Observation Stay (HOSPITAL_COMMUNITY): Payer: Medicare Other

## 2020-05-07 ENCOUNTER — Observation Stay (HOSPITAL_BASED_OUTPATIENT_CLINIC_OR_DEPARTMENT_OTHER): Payer: Medicare Other

## 2020-05-07 DIAGNOSIS — R748 Abnormal levels of other serum enzymes: Secondary | ICD-10-CM

## 2020-05-07 DIAGNOSIS — R4701 Aphasia: Secondary | ICD-10-CM

## 2020-05-07 DIAGNOSIS — T83511A Infection and inflammatory reaction due to indwelling urethral catheter, initial encounter: Secondary | ICD-10-CM

## 2020-05-07 DIAGNOSIS — G459 Transient cerebral ischemic attack, unspecified: Secondary | ICD-10-CM

## 2020-05-07 DIAGNOSIS — N390 Urinary tract infection, site not specified: Secondary | ICD-10-CM

## 2020-05-07 DIAGNOSIS — E785 Hyperlipidemia, unspecified: Secondary | ICD-10-CM | POA: Diagnosis not present

## 2020-05-07 DIAGNOSIS — R030 Elevated blood-pressure reading, without diagnosis of hypertension: Secondary | ICD-10-CM | POA: Diagnosis not present

## 2020-05-07 LAB — CBC
HCT: 42 % (ref 39.0–52.0)
Hemoglobin: 14.5 g/dL (ref 13.0–17.0)
MCH: 32.1 pg (ref 26.0–34.0)
MCHC: 34.5 g/dL (ref 30.0–36.0)
MCV: 92.9 fL (ref 80.0–100.0)
Platelets: 204 10*3/uL (ref 150–400)
RBC: 4.52 MIL/uL (ref 4.22–5.81)
RDW: 12.5 % (ref 11.5–15.5)
WBC: 11.4 10*3/uL — ABNORMAL HIGH (ref 4.0–10.5)
nRBC: 0 % (ref 0.0–0.2)

## 2020-05-07 LAB — COMPREHENSIVE METABOLIC PANEL
ALT: 71 U/L — ABNORMAL HIGH (ref 0–44)
AST: 88 U/L — ABNORMAL HIGH (ref 15–41)
Albumin: 4.2 g/dL (ref 3.5–5.0)
Alkaline Phosphatase: 71 U/L (ref 38–126)
Anion gap: 11 (ref 5–15)
BUN: 14 mg/dL (ref 8–23)
CO2: 27 mmol/L (ref 22–32)
Calcium: 9.6 mg/dL (ref 8.9–10.3)
Chloride: 102 mmol/L (ref 98–111)
Creatinine, Ser: 0.8 mg/dL (ref 0.61–1.24)
GFR, Estimated: >60 mL/min (ref >60)
Glucose, Bld: 101 mg/dL — ABNORMAL HIGH (ref 70–99)
Potassium: 4.1 mmol/L (ref 3.5–5.1)
Sodium: 140 mmol/L (ref 135–145)
Total Bilirubin: 1.2 mg/dL (ref 0.3–1.2)
Total Protein: 7.1 g/dL (ref 6.5–8.1)

## 2020-05-07 LAB — HEMOGLOBIN A1C
Hgb A1c MFr Bld: 5.6 % (ref 4.8–5.6)
Mean Plasma Glucose: 114.02 mg/dL

## 2020-05-07 LAB — ECHOCARDIOGRAM COMPLETE
Area-P 1/2: 1.86 cm2
Height: 76 in
S' Lateral: 3.3 cm
Weight: 3280 oz

## 2020-05-07 LAB — LIPID PANEL
Cholesterol: 259 mg/dL — ABNORMAL HIGH (ref 0–200)
HDL: 31 mg/dL — ABNORMAL LOW (ref >40)
LDL Cholesterol: 191 mg/dL — ABNORMAL HIGH (ref 0–99)
Total CHOL/HDL Ratio: 8.4 RATIO
Triglycerides: 186 mg/dL — ABNORMAL HIGH (ref <150)
VLDL: 37 mg/dL (ref 0–40)

## 2020-05-07 MED ORDER — ASPIRIN 81 MG PO CHEW
243.0000 mg | CHEWABLE_TABLET | ORAL | Status: AC
  Administered 2020-05-07: 243 mg via ORAL
  Filled 2020-05-07: qty 3

## 2020-05-07 MED ORDER — ASPIRIN 325 MG PO TABS: 325.0000 mg | ORAL_TABLET | Freq: Every day | ORAL | Status: DC

## 2020-05-07 MED ORDER — ASPIRIN 81 MG PO CHEW: 324.0000 mg | CHEWABLE_TABLET | Freq: Every day | ORAL | Status: DC

## 2020-05-07 MED ORDER — LORAZEPAM 2 MG/ML IJ SOLN
1.0000 mg | Freq: Once | INTRAMUSCULAR | Status: AC | PRN
  Administered 2020-05-07: 1 mg via INTRAVENOUS
  Filled 2020-05-07: qty 1

## 2020-05-07 MED ORDER — SODIUM CHLORIDE 0.9 % IV SOLN: 1.0000 g | INTRAVENOUS | Status: DC

## 2020-05-07 MED ORDER — CEPHALEXIN 500 MG PO CAPS: 500.0000 mg | ORAL_CAPSULE | Freq: Three times a day (TID) | ORAL | 0 refills | Status: AC

## 2020-05-07 MED ORDER — ASPIRIN 325 MG PO TABS: 325.0000 mg | ORAL_TABLET | Freq: Every day | ORAL | 0 refills | Status: DC

## 2020-05-07 NOTE — Evaluation (Signed)
Occupational Therapy Evaluation Patient Details Name: Matthew Phelps MRN: [id number] DOB: 1945/12/30 Today's Date: 05/07/2020    History of Present Illness Patient presented to [facility] ED on 05/06/20 after having a 1-2 minute episode of expressive aphasia. Patient reports he was at the barber yesterday when he had an acute episode of not being able to get his words out. Brain MRI and MRA unremarkable for acute or subacute event.   Clinical Impression   Patient evaluated by Occupational Therapy with no further acute OT needs identified. All education has been completed and the patient has no further questions.  Pt appears to be back to his baseline level of function with ADLs.  See below for any follow-up Occupational Therapy or equipment needs. OT is signing off. Thank you for this referral.     Follow Up Recommendations    No f/u OT needs   Equipment Recommendations    Spoke with pt about working with podiatrist and orthotist about shoe lift to address LE length discrepancy.    Recommendations for Other Services  PT     Precautions / Restrictions Precautions Precautions: Fall Restrictions Weight Bearing Restrictions: No      Mobility Bed Mobility Overal bed mobility: Modified Independent                  Transfers Overall transfer level: Needs assistance Equipment used: None Transfers: Sit to/from Stand Sit to Stand: Min guard         General transfer comment: Pt initially stood with sudden leg cramp with subsequent decreased standing balance and required Min guard assist to lower back to EOB safely. Pt attended to LE cramp, then stood again with improved balance, initially Min guard, but progressed to supervision.  Pt able to take high marchinig steps in place without assistance and with good balance noted.    Balance Overall balance assessment: Mild deficits observed, not formally tested                                         ADL either  performed or assessed with clinical judgement   ADL Overall ADL's : At baseline                                       General ADL Comments: Pt able to reach down and adjust each sock, mobilize out of bed and stand at or near his baseline. Please see Mobility for further details.     Vision Baseline Vision/History: Wears glasses Wears Glasses: At all times Patient Visual Report: No change from baseline Vision Assessment?: No apparent visual deficits Additional Comments: Intact pursuits, pt denies vision changes.     Perception     Praxis      Pertinent Vitals/Pain Pain Assessment: Faces Faces Pain Scale: Hurts a little bit Pain Location: Pt reports intermittent headaches for past few months that feel like a sharp wire that pokes him and then resolves quickly. Pt reports pain as "zinging". Pt with intiial LE cramp when mobilizing to EOB, but this also resolved when pt sat down and massaged his leg. Pain Intervention(s): Limited activity within patient's tolerance;Monitored during session;Repositioned     Hand Dominance Right   Extremity/Trunk Assessment Upper Extremity Assessment Upper Extremity Assessment: Overall WFL for tasks assessed;RUE deficits/detail;LUE deficits/detail RUE Deficits / Details:  AROM: WNL, MMT: WNL. Movements symmetrical with LT.  Intact Finger to thumb, finger to nose, alteranating finger to nose, and rapid alternating movements. LUE Deficits / Details: AROM: WNL, MMT: WNL. Movements symmetrical with RT.  Intact Finger to thumb, finger to nose, alteranating finger to nose, and rapid alternating movements.   Lower Extremity Assessment Lower Extremity Assessment: Defer to PT evaluation   Cervical / Trunk Assessment Cervical / Trunk Assessment: Normal   Communication Communication Communication: No difficulties   Cognition Arousal/Alertness: Awake/alert Behavior During Therapy: WFL for tasks assessed/performed Overall Cognitive Status:  Within Functional Limits for tasks assessed                                     General Comments  See Mobility.  standing balance initially Min guard to Min assist, but progressed to supervision with perturbations.    Exercises     Shoulder Instructions      Home Living Family/patient expects to be discharged to:: Private residence Living Arrangements: Spouse/significant other Available Help at Discharge: Available 24 hours/day Type of Home: House Home Access: Stairs to enter [employer] of Steps: 2, no rail Entrance Stairs-Rails: None Home Layout: One level     Bathroom Shower/Tub: Walk-in shower;Tub/shower unit   Bathroom Toilet: Standard     Home Equipment: Clinical cytogeneticist - 2 wheels   Additional Comments: May also have cane, but may have given away.      Prior Functioning/Environment Level of Independence: Independent                 OT Problem List: Impaired balance (sitting and/or standing)      OT Treatment/Interventions:      OT Goals(Current goals can be found in the care plan section) Acute Rehab OT Goals Patient Stated Goal: Go home to puppies OT Goal Formulation: With patient/family Potential to Achieve Goals: Good  OT Frequency:     Barriers to D/C:            Co-evaluation              AM-PAC OT "6 Clicks" Daily Activity     Outcome Measure Help from another person eating meals?: None Help from another person taking care of personal grooming?: None Help from another person toileting, which includes using toliet, bedpan, or urinal?: None Help from another person bathing (including washing, rinsing, drying)?: A Little Help from another person to put on and taking off regular upper body clothing?: None Help from another person to put on and taking off regular lower body clothing?: None 6 Click Score: 23   End of Session Equipment Utilized During Treatment:  (none)  Activity Tolerance: Patient  tolerated treatment well Patient left: in bed;with family/visitor present;with call bell/phone within reach (ED stretcher)  OT Visit Diagnosis: Unsteadiness on feet (R26.81)                Time: 1610-9604 OT Time Calculation (min): 19 min Charges:  OT General Charges $OT Visit: 1 Visit OT Evaluation $OT Eval Low Complexity: 1 Low  Kashara Blocher, OT Acute Rehab Services Office: [phone] 05/07/2020   Julien Girt 05/07/2020, 1:57 PM

## 2020-05-07 NOTE — Consult Note (Signed)
Neurology Consultation  Reason for Consult: TIA Referring Physician: Sherrye Payor MD  CC: aphasia lasting 1-2 minutes  History is obtained from: patient, chart  HPI: Matthew Phelps is a 75 y.o. male with a PMHx anxiety, PAF, HLD, HTN, prostate cancer s/p prostatectomy who presented to [facility] ED on 05/06/20 after having a 1-2 minute episode of expressive aphasia. Patient reports he was at the barber yesterday when he had an acute episode of not being able to get his words out. This has never happened to him before. After event, he was able to talk normally. He asked his barber if he noticed any slurred speech or facial droop and the answer was no. No associated acute HA, n/v, change in vision, weakness of extremities, or change in sensation.   Patient has no personal hx of stroke. + stroke in his mother. No hx of brain tumor, bleed, seizures, or brain surgery.   Workup in ED showed normal WBCC and Hgb. Platelets 217, K 3.8, creat .88, glucose 110, ETOH < 10, UDS negative. CT of head showed no acute abnormality. MRI and MRA brain has been ordered. Patient has had carotid US which showed normal vertebral flow and 1-39% stenosis of BICA.    Patient states he has a hx of severe anxiety, so intense, that he has to leave a place if he has an anxiety attack. He does not take any medications for this.   In review of chart, NP does not see where he has been on HTN medications or rate control meds.   LKW: yesterday  ROS: A 14 point ROS was performed and is negative except as noted in the HPI.   Past Medical History:  Diagnosis Date  . A-fib ([place])   . Back pain   . Cancer [facility])    prostate  . GERD (gastroesophageal reflux disease)   . Hypertension   . Neuropathy     Family History  Problem Relation Age of Onset  . Stroke Mother   . Hypertension Mother   . Prostate cancer Father     Social History:   reports that he has quit smoking. He has never used smokeless tobacco. He reports that he  does not drink alcohol and does not use drugs.  Medications  Current Facility-Administered Medications:  .   stroke: mapping our early stages of recovery book, , Does not apply, Once, Lenore Cordia, MD .  acetaminophen (TYLENOL) tablet 650 mg, 650 mg, Oral, Q4H PRN **OR** acetaminophen (TYLENOL) 160 MG/5ML solution 650 mg, 650 mg, Per Tube, Q4H PRN **OR** acetaminophen (TYLENOL) suppository 650 mg, 650 mg, Rectal, Q4H PRN, Zada Finders R, MD .  aspirin chewable tablet 81 mg, 81 mg, Oral, Daily, Zada Finders R, MD, 81 mg at 05/07/20 0959 .  atorvastatin (LIPITOR) tablet 20 mg, 20 mg, Oral, QPM, Patel, Vishal R, MD .  cefTRIAXone (ROCEPHIN) 1 g in sodium chloride 0.9 % 100 mL IVPB, 1 g, Intravenous, Q24H, Patel, Vishal R, MD .  enoxaparin (LOVENOX) injection 40 mg, 40 mg, Subcutaneous, Q24H, Patel, Vishal R, MD, 40 mg at 05/07/20 1000 .  senna-docusate (Senokot-S) tablet 1 tablet, 1 tablet, Oral, QHS PRN, Lenore Cordia, MD  Current Outpatient Medications:  .  atorvastatin (LIPITOR) 20 MG tablet, Take 20 mg by mouth every evening., Disp: , Rfl:  .  cholecalciferol (VITAMIN D3) 25 MCG (1000 UNIT) tablet, Take 1,000 Units by mouth daily., Disp: , Rfl:  .  Multiple Vitamins-Minerals (MULTIVITAMIN ADULTS 50+) TABS, Take  1 tablet by mouth daily., Disp: , Rfl:  .  vitamin C (ASCORBIC ACID) 500 MG tablet, Take 500 mg by mouth daily., Disp: , Rfl:  .  HYDROcodone-acetaminophen (NORCO) 5-325 MG per tablet, Take 1-2 tablets by mouth every 6 (six) hours as needed. (Patient not taking: Reported on 05/06/2020), Disp: 16 tablet, Rfl: 0   Exam: Current vital signs: BP (!) 137/91 (BP Location: Right Arm)   Pulse 67   Temp 98 F (36.7 C) (Oral)   Resp 14   Ht 6\' 4"  (1.93 m)   Wt 93 kg   SpO2 95%   BMI 24.95 kg/m  Vital signs in last 24 hours: Temp:  [97.6 F (36.4 C)-98.4 F (36.9 C)] 98 F (36.7 C) (02/19 1135) Pulse Rate:  [60-88] 67 (02/19 1011) Resp:  [11-18] 14 (02/19 1011) BP:  (122-156)/(80-101) 137/91 (02/19 1011) SpO2:  [91 %-99 %] 95 % (02/19 1011) Weight:  [93 kg] 93 kg (02/18 1822)  GENERAL: Awake, alert in NAD HEENT: - Normocephalic and atraumatic, dry mm, no LN++, no Thyromegaly LUNGS - Normal respiratory effort. SaO2 96% on RA CV - RRR on tele ABDOMEN - Soft, nontender Ext: warm, well perfused Psych: Affect is light  NEURO:  Mental Status: AA&Ox3  Speech/Language: speech is without dysarthria. No aphasia noted. Naming, repetition, fluency, and comprehension intact. Cranial Nerves:  II: PERRL 66mm/brisk. visual fields full.  III, IV, VI: EOMI. Lid elevation symmetric and full.  V: sensation is intact and symmetrical to face. Moves jaw back and forth.  VII: Smile is symmetrical. Able to puff cheeks and raise eyebrows.  VIII: hearing intact to voice IX, X: palate elevation is symmetric. Phonation normal.  XI: normal sternocleidomastoid and trapezius muscle strength PZW:CHENID is symmetrical without fasciculations.   Motor: 5/5 strength is all muscle groups.  Tone is normal. Bulk is normal.  Sensation- Intact to light touch bilaterally in all four extremities. No extinction to light touch DSS.  Coordination: FTN intact bilaterally. HKS intact bilaterally. No pronator drift.  DTRs: 2+ throughout.  Gait- deferred  Labs I have reviewed labs in epic and the results pertinent to this consultation are: INR 1.1               LDL 191      HbA1c 5.6       Glucose 101  CBC    Component Value Date/Time   WBC 11.4 (H) 05/07/2020 0500   RBC 4.52 05/07/2020 0500   HGB 14.5 05/07/2020 0500   HCT 42.0 05/07/2020 0500   PLT 204 05/07/2020 0500   MCV 92.9 05/07/2020 0500   MCH 32.1 05/07/2020 0500   MCHC 34.5 05/07/2020 0500   RDW 12.5 05/07/2020 0500   LYMPHSABS 3.1 05/06/2020 1930   MONOABS 0.8 05/06/2020 1930   EOSABS 0.4 05/06/2020 1930   BASOSABS 0.1 05/06/2020 1930    CMP     Component Value Date/Time   NA 140 05/07/2020 0500   K 4.1  05/07/2020 0500   CL 102 05/07/2020 0500   CO2 27 05/07/2020 0500   GLUCOSE 101 (H) 05/07/2020 0500   BUN 14 05/07/2020 0500   CREATININE 0.80 05/07/2020 0500   CALCIUM 9.6 05/07/2020 0500   PROT 7.1 05/07/2020 0500   ALBUMIN 4.2 05/07/2020 0500   AST 88 (H) 05/07/2020 0500   ALT 71 (H) 05/07/2020 0500   ALKPHOS 71 05/07/2020 0500   BILITOT 1.2 05/07/2020 0500   GFRNONAA >60 05/07/2020 0500   GFRAA >90 04/04/2014 2040  Lipid Panel     Component Value Date/Time   CHOL 259 (H) 05/07/2020 0500   TRIG 186 (H) 05/07/2020 0500   HDL 31 (L) 05/07/2020 0500   CHOLHDL 8.4 05/07/2020 0500   VLDL 37 05/07/2020 0500   LDLCALC 191 (H) 05/07/2020 0500     Imaging MD reviewed the images obtained  CT head---normal head CT  MRI brain---pending MRA brain----pending  Assessment: 75 yo male with a 1-2 minute acute expressive aphasia yesterday at barber shop. No issues after that 2 min event. Symptoms immediately resolved. No other symptoms of stroke at that time. No seizure like activity was observed.  His stroke risk factors are HLD, hx PAF, and HTN. He admits to great anxiety, but his attacks usually happen for more than 1-2 minutes and he has never had an episode like this one. His rhythm on tele is sinus and is echo is still pending. His neurological exam is non focal.    Impression: TIA  Recommendations: -Change dose of Aspirin to 325mg  a day, starting now. Does not need DAPT unless we see something on MRI/MRA.   -Check TSH and Vitamin B12-ordered -increase dose of Atorvastatin to 80mg  po qd and have patient f/up with PCP in 3 mos for recheck of lipids.  -His stroke workup has or will be finished at [facility], so patient does not need to transfer to [facility] at this time.  -We will f/up MRI/MRI brain.   Pt seen by Clance Boll, NP/Neuro and later by MD. Note/plan to be edited by MD as needed.  Pager: [phone]  Attestation:  I saw this patient with the APP on 05/07/20, obtained  pertinent aspects of the history, and performed relevant physical and neurological examination as documented. Also, I reviewed the available laboratory data and neuroimages, and other relevant tests/notes/procedures.  Patient had a short episode 1-2 minutes during which he could not "find the words" to say what he wanted to say. Comprehension was OK. Noticed no other symptoms (visual, sensory, dizziness, confusion, etc.). Never had a similar spell. When he got home after the event, which occurred as he was getting a haircut, his BP was uncharacteristically high. Did not attempt to write during the spell.  MRI/MRA brain are unremarkable (personally reviewed).  My examination findings include normal findings except peripheral neuropathy (absent ankle reflexes, diminished distal sensation to temperature and vibration).  Impression: Probable TIA Peripheral neuropathy (known problem) Several vascular risk factors, including HLD, PAF, HTN.  Recommendations: - ASA 325 mg/d for now. - consider 30-day event monitor (need can be determined and ordered if necessary at his neurology follow up appointment) - outpatient stroke/neurology follow up appointment - statin (he seems to think the neuropathy came from statin Rx, however, and I doubt he will be compliant) - outpatient workup (if not already done) for more likely causes of his peripheral neuropathy, which is a rare side effect of medications, particularly statins. - OK for discharge to home from neuro standpoint  Neurology is available to assist prn.  Thank you.  Perfecto Kingdom, MD

## 2020-05-07 NOTE — ED Notes (Signed)
Discharge instructions reviewed with patient using teach back method. Patient discharged home with wife

## 2020-05-07 NOTE — Evaluation (Signed)
Physical Therapy Evaluation Patient Details Name: Matthew Phelps MRN: [id number] DOB: 11/24/1945 Today's Date: 05/07/2020   History of Present Illness  Patient presented to [facility] ED on 05/06/20 after having a 1-2 minute episode of expressive aphasia. Patient reports he was at the barber yesterday when he had an acute episode of not being able to get his words out. Brain MRI and MRA unremarkable for acute or subacute event.  Clinical Impression  Patient presents close to functional baseline.  Reports some feeling of weakness in his legs he attributes to in bed for a day and possibly from medication/contrast.  Feel he is safe for home with wife support and no follow up PT needs.  Did educate in FAST for stroke symptoms/signs and importance in timing of medical assessment.     Follow Up Recommendations No PT follow up    Equipment Recommendations  None recommended by PT    Recommendations for Other Services       Precautions / Restrictions Precautions Precautions: None Restrictions Weight Bearing Restrictions: No      Mobility  Bed Mobility Overal bed mobility: Modified Independent                  Transfers Overall transfer level: Needs assistance Equipment used: None Transfers: Sit to/from Stand Sit to Stand: Supervision         General transfer comment: Pt initially stood with sudden leg cramp with subsequent decreased standing balance and required Min guard assist to lower back to EOB safely. Pt attended to LE cramp, then stood again with improved balance, initially Min guard, but progressed to supervision.  Pt able to take high marchinig steps in place without assistance and with good balance noted.  Ambulation/Gait Ambulation/Gait assistance: Supervision;Min guard Gait Distance (Feet): 300 Feet Assistive device: None Gait Pattern/deviations: Step-through pattern;Drifts right/left     General Gait Details: Reports history of veering when he walks  Stairs             Wheelchair Mobility    Modified Rankin (Stroke Patients Only) Modified Rankin (Stroke Patients Only) Pre-Morbid Rankin Score: No symptoms Modified Rankin: No significant disability     Balance Overall balance assessment: Modified Independent                               Standardized Balance Assessment Standardized Balance Assessment : Dynamic Gait Index   Dynamic Gait Index Level Surface: Normal Change in Gait Speed: Normal Gait with Horizontal Head Turns: Mild Impairment Gait and Pivot Turn: Normal Step Over Obstacle: Normal Step Around Obstacles: Normal       Pertinent Vitals/Pain Pain Assessment: No/denies pain Faces Pain Scale: Hurts a little bit Pain Location: Pt reports intermittent headaches for past few months that feel like a sharp wire that pokes him and then resolves quickly. Pt reports pain as "zinging". Pt with intiial LE cramp when mobilizing to EOB, but this also resolved when pt sat down and massaged his leg. Pain Intervention(s): Limited activity within patient's tolerance;Monitored during session;Repositioned    Home Living Family/patient expects to be discharged to:: Private residence Living Arrangements: Spouse/significant other Available Help at Discharge: Available 24 hours/day Type of Home: House Home Access: Stairs to enter Entrance Stairs-Rails: None Entrance Stairs-Number of Steps: 2, no rail Home Layout: One level Home Equipment: Clinical cytogeneticist - 2 wheels Additional Comments: May also have cane, but may have given away.    Prior Function Level of  Independence: Independent               Hand Dominance   Dominant Hand: Right    Extremity/Trunk Assessment   Upper Extremity Assessment Upper Extremity Assessment: Overall WFL for tasks assessed;RUE deficits/detail;LUE deficits/detail RUE Deficits / Details: AROM: WNL, MMT: WNL. Movements symmetrical with LT.  Intact Finger to thumb, finger to nose,  alteranating finger to nose, and rapid alternating movements. LUE Deficits / Details: AROM: WNL, MMT: WNL. Movements symmetrical with RT.  Intact Finger to thumb, finger to nose, alteranating finger to nose, and rapid alternating movements.    Lower Extremity Assessment Lower Extremity Assessment: Overall WFL for tasks assessed    Cervical / Trunk Assessment Cervical / Trunk Assessment: Normal  Communication   Communication: No difficulties  Cognition Arousal/Alertness: Awake/alert Behavior During Therapy: WFL for tasks assessed/performed Overall Cognitive Status: Within Functional Limits for tasks assessed                                        General Comments General comments (skin integrity, edema, etc.): reports positive LLD; educated in FAST for noticing symptoms of stroke and discussed importance of time to get to hospital    Exercises     Assessment/Plan    PT Assessment Patent does not need any further PT services  PT Problem List         PT Treatment Interventions      PT Goals (Current goals can be found in the Care Plan section)  Acute Rehab PT Goals Patient Stated Goal: Go home to puppies PT Goal Formulation: All assessment and education complete, DC therapy    Frequency     Barriers to discharge        Co-evaluation               AM-PAC PT "6 Clicks" Mobility  Outcome Measure Help needed turning from your back to your side while in a flat bed without using bedrails?: None Help needed moving from lying on your back to sitting on the side of a flat bed without using bedrails?: None Help needed moving to and from a bed to a chair (including a wheelchair)?: None Help needed standing up from a chair using your arms (e.g., wheelchair or bedside chair)?: None Help needed to walk in hospital room?: None Help needed climbing 3-5 steps with a railing? : A Little 6 Click Score: 23    End of Session   Activity Tolerance: Patient  tolerated treatment well Patient left: in bed;with family/visitor present (sitting EOB)   PT Visit Diagnosis: Other abnormalities of gait and mobility (R26.89)    Time: 3704-8889 PT Time Calculation (min) (ACUTE ONLY): 17 min   Charges:   PT Evaluation $PT Eval Low Complexity: 1 Low          Magda Kiel, PT Acute Rehabilitation Services Pager:[phone] [phone] 05/07/2020   Reginia Naas 05/07/2020, 4:10 PM

## 2020-05-07 NOTE — ED Notes (Signed)
Breakfast tray at bedside 

## 2020-05-07 NOTE — Progress Notes (Signed)
Echocardiogram 2D Echocardiogram has been performed.  Matthew Phelps 05/07/2020, 8:39 AM

## 2020-05-07 NOTE — Discharge Summary (Signed)
Physician Discharge Summary  Matthew Phelps SEG:[id number] DOB: 1945/11/28 DOA: 05/06/2020  PCP: Ellene Route  Admit date: 05/06/2020 Discharge date: 05/07/2020  Admitted From: Home Disposition: Home  Recommendations for Outpatient Follow-up:  1. Follow ups as below. 2. Please obtain CBC/BMP/Mag at follow up 3. Please follow up on the following pending results: None  Home Health: None required Equipment/Devices: None required  Discharge Condition: Stable CODE STATUS: Full code   Follow-up Information    [facility]. Schedule an appointment as soon as possible for a visit in 2 week(s).   Contact information: [address] [place] [address] [phone]        [facility]. Schedule an appointment as soon as possible for a visit in 3 week(s).   Specialty: Neurology Contact information: [facility] Hospital Course: 75 year old male with history of remote paroxysmal A. fib not on AC, prostate cancer status post prostatectomy, chronic transaminitis and hyperlipidemia presenting with transient expressive aphasia that lasted about 5 minutes while getting his haircut, and admitted for CVA work-up.  He was slightly hypertensive.  Work-up including basic labs unremarkable except for mild elevated LFT (chronic) and urinalysis with nitrites, large LE and many bacteria.  Urine culture obtained.  Started on IV ceftriaxone.   CVA work-up including UDS, CT head without contrast, MRI brain, MRA head, TTE and bilateral carotid ultrasound without significant finding.  LDL 191 despite good compliance with his Crestor.  A1c 5.6%.    Patient remained free of neuro symptoms throughout his ED stay.  Neurology cleared him for discharge on full dose aspirin daily with outpatient follow-up in 2 to 4 weeks.   Patient was also referred to lipid clinic for elevated LDL  despite good compliance with his Crestor.  Patient was discharged on p.o. Keflex 500 mg 3 times daily for 5 days pending urine culture.  See individual problem list below for more on hospital course.  Discharge Diagnoses:  Transient aphasia/TIA-CVA work-up without significant finding -Neurology recommended full dose aspirin -Patient to continue home Crestor pending follow-up at lipid clinic for further evaluation -Outpatient follow-up with neurology in 2 to 4 weeks  Remarks paroxysmal A. Fib: In sinus rhythm now.  TTE without significant finding.  Not on medication. -Started on full dose aspirin for TIA.  Elevated blood pressure measurement-no history of HTN.  Not on antihypertensive medication.  Resolved.  UTI-patient with dysuria.  Urinalysis concerning -Received IV ceftriaxone in house -Discharged on p.o. Keflex for 5 more days -Follow urine culture  Chronic transaminitis: Mildly elevated AST and ALT. Reportedly chronic issue before he even started taking Crestor. -Recheck CMP at follow-up  History of prostate cancer status post prostatectomy  Hyperlipidemia: TC 259.  HDL 31.  LDL 191.  TG 186. -Continue home Crestor -Ambulatory referral to lipid clinic   Body mass index is 24.95 kg/m.            Discharge Exam: Vitals:   05/07/20 1135 05/07/20 1300  BP:  108/76  Pulse:  63  Resp:  13  Temp: 98 F (36.7 C)   SpO2:  96%    GENERAL: No apparent distress.  Nontoxic. HEENT: MMM.  Vision and hearing grossly intact.  NECK: Supple.  No apparent JVD.  RESP: On RA.  No IWOB.  Fair aeration bilaterally. CVS:  RRR. Heart sounds normal.  ABD/GI/GU: Bowel sounds present.  Soft. Non tender.  MSK/EXT:  Moves extremities. No apparent deformity. No edema.  SKIN: no apparent skin lesion or wound NEURO: Awake, alert and oriented appropriately. Speech clear. Cranial nerves II-XII  intact. Motor 5/5 in all muscle groups of UE and LE bilaterally, Normal tone. Light sensation  intact in all dermatomes of upper and lower ext bilaterally. Patellar reflex symmetric.  No pronator drift.  Finger to nose intact. PSYCH: Calm. Normal affect.  Discharge Instructions  Discharge Instructions    AMB Referral to Advanced Lipid Disorders Clinic   Complete by: As directed    Internal Lipid Clinic Referral Scheduling  Internal lipid clinic referrals are providers within [facility], who wish to refer established patients for routine management (help in starting PCSK9 inhibitor therapy) or advanced therapies.  Internal MD referral criteria:              1. All patients with LDL>190 mg/dL  2. All patients with Triglycerides >500 mg/dL  3. Patients with suspected or confirmed heterozygous familial hyperlipidemia (HeFH) or homozygous familial hyperlipidemia (HoFH)  4. Patients with family history of suspicious for genetic dyslipidemia desiring genetic testing  5. Patients refractory to standard guideline based therapy  6. Patients with statin intolerance (failed 2 statins, one of which must be a high potency statin)  7. Patients who the provider desires to be seen by MD   Internal PharmD referral criteria:   1. Follow-up patients for medication management  2. Follow-up for compliance monitoring  3. Patients for drug education  4. Patients with statin intolerance  5. PCSK9 inhibitor education and prior authorization approvals  6. Patients with triglycerides <500 mg/dL  External Lipid Clinic Referral  External lipid clinic referrals are for providers outside of [facility], considered new clinic patients - automatically routed to MD schedule   Ambulatory referral to Neurology   Complete by: As directed    An appointment is requested in approximately: 2 weeks   Diet - low sodium heart healthy   Complete by: As directed    Discharge instructions   Complete by: As directed    It has been a pleasure taking care of you!  You were hospitalized due to transient aphasia  (difficulty speaking).  The test is we have done did not reveal stroke.  Your symptoms could be due to transient ischemic attack (short-lived stroke like symptoms).  Neurology recommended taking full dose aspirin daily and follow-up in the clinic in 2 to 4 weeks.  We have sent referral to neurology for this follow up.  We have also sent referral to lipid clinic for better management of your cholesterol.    In regards to possible urinary tract infection.  We have started you on antibiotic, and discharging you more of this antibiotic to complete treatment course.   Take care,   Increase activity slowly   Complete by: As directed      Allergies as of 05/07/2020      Reactions   Contrast Media [iodinated Diagnostic Agents]    Sulfa Antibiotics    Tetracyclines & Related       Medication List    STOP taking these medications   HYDROcodone-acetaminophen 5-325 MG tablet Commonly known as: Norco     TAKE these medications   aspirin 325 MG tablet Take 1 tablet (325 mg total) by mouth daily. Start taking on: May 08, 2020   atorvastatin 20 MG tablet Commonly known as: LIPITOR Take 20 mg by mouth every evening.   cephALEXin 500 MG  capsule Commonly known as: KEFLEX Take 1 capsule (500 mg total) by mouth 3 (three) times daily for 5 days.   cholecalciferol 25 MCG (1000 UNIT) tablet Commonly known as: VITAMIN D3 Take 1,000 Units by mouth daily.   Multivitamin Adults 50+ Tabs Take 1 tablet by mouth daily.   vitamin C 500 MG tablet Commonly known as: ASCORBIC ACID Take 500 mg by mouth daily.       Consultations:  Neurology  Procedures/Studies:  2D Echo on 05/07/2020 1. Left ventricular ejection fraction, by estimation, is 55 to 60%. The  left ventricle has normal function. The left ventricle has no regional  wall motion abnormalities. There is mild left ventricular hypertrophy.  Left ventricular diastolic parameters  are consistent with Grade I diastolic dysfunction  (impaired relaxation).  2. Right ventricular systolic function is normal. The right ventricular  size is normal.  3. The mitral valve is normal in structure. Trivial mitral valve  regurgitation.  4. The aortic valve is tricuspid. Aortic valve regurgitation is not  visualized. Mild aortic valve sclerosis is present, with no evidence of  aortic valve stenosis.  5. The inferior vena cava is normal in size with greater than 50%  respiratory variability, suggesting right atrial pressure of 3 mmHg.    CT HEAD WO CONTRAST  Result Date: 05/06/2020 CLINICAL DATA:  Recent episode of slurred speech and word-finding difficulty. EXAM: CT HEAD WITHOUT CONTRAST TECHNIQUE: Contiguous axial images were obtained from the base of the skull through the vertex without intravenous contrast. COMPARISON:  Head CT 07/03/2006 FINDINGS: Brain: There is no mass, hemorrhage or extra-axial collection. The size and configuration of the ventricles and extra-axial CSF spaces are normal. The brain parenchyma is normal, without acute or chronic infarction. Vascular: No abnormal hyperdensity of the major intracranial arteries or dural venous sinuses. No intracranial atherosclerosis. Skull: The visualized skull base, calvarium and extracranial soft tissues are normal. Sinuses/Orbits: No fluid levels or advanced mucosal thickening of the visualized paranasal sinuses. No mastoid or middle ear effusion. The orbits are normal. IMPRESSION: Normal head CT. Electronically Signed   By: Ulyses Jarred M.D.   On: 05/06/2020 22:09   MR ANGIO HEAD WO CONTRAST  Result Date: 05/07/2020 CLINICAL DATA:  Recent episode of slurred speech and word-finding difficulty. EXAM: MRI HEAD WITHOUT CONTRAST MRA HEAD WITHOUT CONTRAST TECHNIQUE: Multiplanar, multiecho pulse sequences of the brain and surrounding structures were obtained without intravenous contrast. Angiographic images of the head were obtained using MRA technique without contrast. COMPARISON:   Head CT from yesterday FINDINGS: MRI HEAD FINDINGS Brain: No acute infarction, hemorrhage, hydrocephalus, extra-axial collection or mass lesion. Symmetric narrow appearance of sulci at the vertex, stable from 2007. Minor periventricular chronic small vessel ischemic change, essentially age congruent. Age normal brain volume. Vascular: Normal flow voids. Skull and upper cervical spine: Normal marrow signal Sinuses/Orbits: Negative MRA HEAD FINDINGS Symmetric carotid and vertebral artery size. The carotid, vertebral, and basilar arteries are smooth and widely patent. No branch occlusion, beading, or aneurysm. Fetal type PCA flow, more heavily fetal weighted on the left which accounts for differential intensity of the posterior cerebral arteries. IMPRESSION: Unremarkable brain MRI and MRA.  No acute or subacute infarct. Electronically Signed   By: Monte Fantasia M.D.   On: 05/07/2020 11:40   MR BRAIN WO CONTRAST  Result Date: 05/07/2020 CLINICAL DATA:  Recent episode of slurred speech and word-finding difficulty. EXAM: MRI HEAD WITHOUT CONTRAST MRA HEAD WITHOUT CONTRAST TECHNIQUE: Multiplanar, multiecho pulse sequences of the brain and  surrounding structures were obtained without intravenous contrast. Angiographic images of the head were obtained using MRA technique without contrast. COMPARISON:  Head CT from yesterday FINDINGS: MRI HEAD FINDINGS Brain: No acute infarction, hemorrhage, hydrocephalus, extra-axial collection or mass lesion. Symmetric narrow appearance of sulci at the vertex, stable from 2007. Minor periventricular chronic small vessel ischemic change, essentially age congruent. Age normal brain volume. Vascular: Normal flow voids. Skull and upper cervical spine: Normal marrow signal Sinuses/Orbits: Negative MRA HEAD FINDINGS Symmetric carotid and vertebral artery size. The carotid, vertebral, and basilar arteries are smooth and widely patent. No branch occlusion, beading, or aneurysm. Fetal type PCA  flow, more heavily fetal weighted on the left which accounts for differential intensity of the posterior cerebral arteries. IMPRESSION: Unremarkable brain MRI and MRA.  No acute or subacute infarct. Electronically Signed   By: Monte Fantasia M.D.   On: 05/07/2020 11:40   ECHOCARDIOGRAM COMPLETE  Result Date: 05/07/2020    ECHOCARDIOGRAM REPORT   Patient Name:   Matthew Phelps Date of Exam: 05/07/2020 Medical Rec #:  [id number]          Height:       76.0 in Accession #:    [id number]         Weight:       205.0 lb Date of Birth:  February 18, 1946          BSA:          2.239 m Patient Age:    9 years           BP:           128/85 mmHg Patient Gender: M                  HR:           62 bpm. Exam Location:  Inpatient Procedure: 2D Echo, Cardiac Doppler and Color Doppler Indications:    TIA 435.9 / G45.9  History:        Patient has no prior history of Echocardiogram examinations.                 Arrythmias:Atrial Fibrillation; Risk Factors:Hypertension.  Sonographer:    Bernadene Person RDCS Referring Phys: [id number] [place]  1. Left ventricular ejection fraction, by estimation, is 55 to 60%. The left ventricle has normal function. The left ventricle has no regional wall motion abnormalities. There is mild left ventricular hypertrophy. Left ventricular diastolic parameters are consistent with Grade I diastolic dysfunction (impaired relaxation).  2. Right ventricular systolic function is normal. The right ventricular size is normal.  3. The mitral valve is normal in structure. Trivial mitral valve regurgitation.  4. The aortic valve is tricuspid. Aortic valve regurgitation is not visualized. Mild aortic valve sclerosis is present, with no evidence of aortic valve stenosis.  5. The inferior vena cava is normal in size with greater than 50% respiratory variability, suggesting right atrial pressure of 3 mmHg. Comparison(s): No prior Echocardiogram. Conclusion(s)/Recommendation(s): No intracardiac source  of embolism detected on this transthoracic study. A transesophageal echocardiogram is recommended to exclude cardiac source of embolism if clinically indicated. FINDINGS  Left Ventricle: Left ventricular ejection fraction, by estimation, is 55 to 60%. The left ventricle has normal function. The left ventricle has no regional wall motion abnormalities. The left ventricular internal cavity size was normal in size. There is  mild left ventricular hypertrophy. Left ventricular diastolic parameters are consistent with Grade I diastolic dysfunction (impaired relaxation). Indeterminate filling pressures.  Right Ventricle: The right ventricular size is normal. No increase in right ventricular wall thickness. Right ventricular systolic function is normal. Left Atrium: Left atrial size was normal in size. Right Atrium: Right atrial size was normal in size. Pericardium: There is no evidence of pericardial effusion. Mitral Valve: The mitral valve is normal in structure. Trivial mitral valve regurgitation. Tricuspid Valve: The tricuspid valve is grossly normal. Tricuspid valve regurgitation is trivial. Aortic Valve: The aortic valve is tricuspid. Aortic valve regurgitation is not visualized. Mild aortic valve sclerosis is present, with no evidence of aortic valve stenosis. Pulmonic Valve: The pulmonic valve was normal in structure. Pulmonic valve regurgitation is not visualized. Aorta: The aortic root and ascending aorta are structurally normal, with no evidence of dilitation. Venous: The inferior vena cava is normal in size with greater than 50% respiratory variability, suggesting right atrial pressure of 3 mmHg. IAS/Shunts: No atrial level shunt detected by color flow Doppler.  LEFT VENTRICLE PLAX 2D LVIDd:         5.00 cm  Diastology LVIDs:         3.30 cm  LV e' medial:    5.96 cm/s LV PW:         0.70 cm  LV E/e' medial:  8.9 LV IVS:        1.00 cm  LV e' lateral:   8.50 cm/s LVOT diam:     2.10 cm  LV E/e' lateral: 6.2 LV  SV:         51 LV SV Index:   23 LVOT Area:     3.46 cm  RIGHT VENTRICLE RV S prime:     10.80 cm/s TAPSE (M-mode): 1.9 cm LEFT ATRIUM             Index       RIGHT ATRIUM           Index LA diam:        2.70 cm 1.21 cm/m  RA Area:     11.60 cm LA Vol (A2C):   32.5 ml 14.52 ml/m RA Volume:   25.50 ml  11.39 ml/m LA Vol (A4C):   31.3 ml 13.98 ml/m LA Biplane Vol: 33.1 ml 14.79 ml/m  AORTIC VALVE LVOT Vmax:   65.70 cm/s LVOT Vmean:  45.800 cm/s LVOT VTI:    0.148 m  AORTA Ao Root diam: 3.50 cm Ao Asc diam:  3.50 cm MITRAL VALVE MV Area (PHT): 1.86 cm    SHUNTS MV Decel Time: 408 msec    Systemic VTI:  0.15 m MV E velocity: 52.90 cm/s  Systemic Diam: 2.10 cm MV A velocity: 50.80 cm/s MV E/A ratio:  1.04 Lyman Bishop MD Electronically signed by Lyman Bishop MD Signature Date/Time: 05/07/2020/12:45:07 PM    Final    VAS US CAROTID (at [facility] and [facility] only)  Result Date: 05/07/2020 Carotid Arterial Duplex Study Indications:       TIA and Speech disturbance. Risk Factors:      Hyperlipidemia. Comparison Study:  No prior studies. Performing Technologist: Oliver Hum RVT  Examination Guidelines: A complete evaluation includes B-mode imaging, spectral Doppler, color Doppler, and power Doppler as needed of all accessible portions of each vessel. Bilateral testing is considered an integral part of a complete examination. Limited examinations for reoccurring indications may be performed as noted.  Right Carotid Findings: +----------+--------+--------+--------+-----------------------+--------+           PSV cm/sEDV cm/sStenosisPlaque Description     Comments +----------+--------+--------+--------+-----------------------+--------+ CCA Prox  127  27              smooth and heterogenous         +----------+--------+--------+--------+-----------------------+--------+ CCA Distal71      21              smooth and heterogenous          +----------+--------+--------+--------+-----------------------+--------+ ICA Prox  44      14              smooth and heterogenous         +----------+--------+--------+--------+-----------------------+--------+ ICA Distal63      15                                     tortuous +----------+--------+--------+--------+-----------------------+--------+ ECA       92      10                                              +----------+--------+--------+--------+-----------------------+--------+ +----------+--------+-------+--------+-------------------+           PSV cm/sEDV cmsDescribeArm Pressure (mmHG) +----------+--------+-------+--------+-------------------+ [id number]                                         +----------+--------+-------+--------+-------------------+ +---------+--------+--+--------+--+---------+ VertebralPSV cm/s36EDV cm/s10Antegrade +---------+--------+--+--------+--+---------+  Left Carotid Findings: +----------+--------+--------+--------+-----------------------+--------+           PSV cm/sEDV cm/sStenosisPlaque Description     Comments +----------+--------+--------+--------+-----------------------+--------+ CCA Prox  94      22              smooth and heterogenous         +----------+--------+--------+--------+-----------------------+--------+ CCA Distal46      14              smooth and heterogenous         +----------+--------+--------+--------+-----------------------+--------+ ICA Prox  39      18              smooth and heterogenous         +----------+--------+--------+--------+-----------------------+--------+ ICA Distal70      28                                     tortuous +----------+--------+--------+--------+-----------------------+--------+ ECA       65      11                                              +----------+--------+--------+--------+-----------------------+--------+  +----------+--------+--------+--------+-------------------+           PSV cm/sEDV cm/sDescribeArm Pressure (mmHG) +----------+--------+--------+--------+-------------------+ [id number]                                         +----------+--------+--------+--------+-------------------+ +---------+--------+--+--------+--+---------+ VertebralPSV cm/s35EDV cm/s10Antegrade +---------+--------+--+--------+--+---------+   Summary: Right Carotid: Velocities in the right ICA are consistent with a 1-39% stenosis. Left Carotid: Velocities in the left ICA are consistent with a 1-39% stenosis. Vertebrals: Bilateral vertebral arteries demonstrate antegrade flow. *  See table(s) above for measurements and observations.     Preliminary         The results of significant diagnostics from this hospitalization (including imaging, microbiology, ancillary and laboratory) are listed below for reference.     Microbiology: Recent Results (from the past 240 hour(s))  Resp Panel by RT-PCR (Flu A&B, Covid) Nasopharyngeal Swab     Status: None   Collection Time: 05/06/20 10:15 PM   Specimen: Nasopharyngeal Swab; Nasopharyngeal(NP) swabs in vial transport medium  Result Value Ref Range Status   SARS Coronavirus 2 by RT PCR NEGATIVE NEGATIVE Final    Comment: (NOTE) SARS-CoV-2 target nucleic acids are NOT DETECTED.  The SARS-CoV-2 RNA is generally detectable in upper respiratory specimens during the acute phase of infection. The lowest concentration of SARS-CoV-2 viral copies this assay can detect is 138 copies/mL. A negative result does not preclude SARS-Cov-2 infection and should not be used as the sole basis for treatment or other patient management decisions. A negative result may occur with  improper specimen collection/handling, submission of specimen other than nasopharyngeal swab, presence of viral mutation(s) within the areas targeted by this assay, and inadequate number of  viral copies(<138 copies/mL). A negative result must be combined with clinical observations, patient history, and epidemiological information. The expected result is Negative.  Fact Sheet for Patients:  EntrepreneurPulse.com.au  Fact Sheet for Healthcare Providers:  IncredibleEmployment.be  This test is no t yet approved or cleared by the [place] FDA and  has been authorized for detection and/or diagnosis of SARS-CoV-2 by FDA under an Emergency Use Authorization (EUA). This EUA will remain  in effect (meaning this test can be used) for the duration of the COVID-19 declaration under Section 564(b)(1) of the Act, 21 U.S.C.section 360bbb-3(b)(1), unless the authorization is terminated  or revoked sooner.       Influenza A by PCR NEGATIVE NEGATIVE Final   Influenza B by PCR NEGATIVE NEGATIVE Final    Comment: (NOTE) The Xpert Xpress SARS-CoV-2/FLU/RSV plus assay is intended as an aid in the diagnosis of influenza from Nasopharyngeal swab specimens and should not be used as a sole basis for treatment. Nasal washings and aspirates are unacceptable for Xpert Xpress SARS-CoV-2/FLU/RSV testing.  Fact Sheet for Patients: EntrepreneurPulse.com.au  Fact Sheet for Healthcare Providers: IncredibleEmployment.be  This test is not yet approved or cleared by the [place] FDA and has been authorized for detection and/or diagnosis of SARS-CoV-2 by FDA under an Emergency Use Authorization (EUA). This EUA will remain in effect (meaning this test can be used) for the duration of the COVID-19 declaration under Section 564(b)(1) of the Act, 21 U.S.C. section 360bbb-3(b)(1), unless the authorization is terminated or revoked.  Performed at [facility], [place] [address], [place], [place] [address]      Labs:  CBC: Recent Labs  Lab 05/06/20 1930 05/06/20 2006 05/07/20 0500  WBC 9.1  --   11.4*  NEUTROABS 4.7  --   --   HGB 14.9 14.6 14.5  HCT 42.5 43.0 42.0  MCV 92.2  --  92.9  PLT 217  --  204   BMP &GFR Recent Labs  Lab 05/06/20 1930 05/06/20 2006 05/07/20 0500  NA 139 140 140  K 3.8 3.8 4.1  CL 101 101 102  CO2 28  --  27  GLUCOSE 110* 108* 101*  BUN 14 15 14   CREATININE 0.88 0.80 0.80  CALCIUM 9.6  --  9.6   Estimated Creatinine Clearance: 102.5 mL/min (by  C-G formula based on SCr of 0.8 mg/dL). Liver & Pancreas: Recent Labs  Lab 05/06/20 1930 05/07/20 0500  AST 94* 88*  ALT 77* 71*  ALKPHOS 75 71  BILITOT 1.2 1.2  PROT 8.0 7.1  ALBUMIN 4.7 4.2   No results for input(s): LIPASE, AMYLASE in the last 168 hours. No results for input(s): AMMONIA in the last 168 hours. Diabetic: Recent Labs    05/07/20 0500  HGBA1C 5.6   No results for input(s): GLUCAP in the last 168 hours. Cardiac Enzymes: No results for input(s): CKTOTAL, CKMB, CKMBINDEX, TROPONINI in the last 168 hours. No results for input(s): PROBNP in the last 8760 hours. Coagulation Profile: Recent Labs  Lab 05/06/20 1930  INR 1.1   Thyroid Function Tests: No results for input(s): TSH, T4TOTAL, FREET4, T3FREE, THYROIDAB in the last 72 hours. Lipid Profile: Recent Labs    05/07/20 0500  CHOL 259*  HDL 31*  LDLCALC 191*  TRIG 186*  CHOLHDL 8.4   Anemia Panel: No results for input(s): VITAMINB12, FOLATE, FERRITIN, TIBC, IRON, RETICCTPCT in the last 72 hours. Urine analysis:    Component Value Date/Time   COLORURINE YELLOW 05/06/2020 2018   APPEARANCEUR HAZY (A) 05/06/2020 2018   LABSPEC 1.009 05/06/2020 2018   PHURINE 6.0 05/06/2020 2018   GLUCOSEU NEGATIVE 05/06/2020 2018   HGBUR NEGATIVE 05/06/2020 2018   [place] NEGATIVE 05/06/2020 2018   KETONESUR NEGATIVE 05/06/2020 2018   PROTEINUR NEGATIVE 05/06/2020 2018   NITRITE POSITIVE (A) 05/06/2020 2018   LEUKOCYTESUR LARGE (A) 05/06/2020 2018   Sepsis Labs: Invalid input(s): PROCALCITONIN, LACTICIDVEN   Time  coordinating discharge: 35 minutes  SIGNED:  Mercy Riding, MD  [facility] 05/07/2020, 1:49 PM  If 7PM-7AM, please contact night-coverage [email or web address]

## 2020-05-07 NOTE — Evaluation (Signed)
SLP Cancellation Note  Patient Details Name: Matthew Phelps MRN: [id number] DOB: 1945-11-14   Cancelled treatment:       Reason Eval/Treat Not Completed: Other (comment) (pt at [facility] and to transfer to [facility] per orders, will continue efforts) Kathleen Lime, MS [facility] SLP [place] Office [phone] Pager [phone]    Macario Golds 05/07/2020, 7:32 AM

## 2020-05-07 NOTE — Progress Notes (Signed)
Carotid artery duplex has been completed. Preliminary results can be found in CV Proc through chart review.   05/07/20 11:11 AM Matthew Phelps RVT

## 2020-05-09 LAB — URINE CULTURE: Culture: >=100000 — AB

## 2020-05-10 ENCOUNTER — Telehealth: Payer: Self-pay | Admitting: Emergency Medicine

## 2020-05-10 NOTE — Telephone Encounter (Signed)
Post ED Visit - Positive Culture Follow-up  Culture report reviewed by antimicrobial stewardship pharmacist: [place] Team []  [address], Pharm.D. []  Heide Guile, Pharm.D., BCPS AQ-ID []  Parks Neptune, Pharm.D., BCPS []  Alycia Rossetti, Pharm.D., BCPS []  [place], [place].D., BCPS, AAHIVP []  Legrand Como, Pharm.D., BCPS, AAHIVP []  Salome Arnt, PharmD, BCPS []  Johnnette Gourd, PharmD, BCPS []  Hughes Better, PharmD, BCPS []  Leeroy Cha, PharmD []  Laqueta Linden, PharmD, BCPS []  Albertina Parr, PharmD  [place] Team []  Leodis Sias, PharmD []  Lindell Spar, PharmD []  Royetta Asal, PharmD []  Graylin Shiver, Rph []  Rema Fendt) Glennon Mac, PharmD []  Arlyn Dunning, PharmD []  Netta Cedars, PharmD []  Dia Sitter, PharmD []  Leone Haven, PharmD []  Gretta Arab, PharmD []  Theodis Shove, PharmD []  Peggyann Juba, PharmD []  Reuel Boom, PharmD   Positive urine culture Treated with cephalexin, organism sensitive to the same and no further patient follow-up is required at this time.  Hazle Nordmann 05/10/2020, 10:09 AM

## 2020-05-17 ENCOUNTER — Encounter: Payer: Self-pay | Admitting: *Deleted

## 2020-05-17 ENCOUNTER — Encounter: Payer: Self-pay | Admitting: Diagnostic Neuroimaging

## 2020-05-17 ENCOUNTER — Ambulatory Visit: Payer: Medicare Other | Admitting: Diagnostic Neuroimaging

## 2020-05-17 ENCOUNTER — Other Ambulatory Visit: Payer: Self-pay

## 2020-05-17 VITALS — BP 132/78 | HR 72 | Ht 76.0 in | Wt 210.2 lb

## 2020-05-17 DIAGNOSIS — G459 Transient cerebral ischemic attack, unspecified: Secondary | ICD-10-CM | POA: Diagnosis not present

## 2020-05-17 DIAGNOSIS — I480 Paroxysmal atrial fibrillation: Secondary | ICD-10-CM | POA: Diagnosis not present

## 2020-05-17 NOTE — Progress Notes (Signed)
[facility]  PATIENT: Matthew Phelps DOB: [birth year]  REFERRING CLINICIAN: Ellene Route HISTORY FROM: patient  REASON FOR VISIT: new consult    HISTORICAL  CHIEF COMPLAINT:  Chief Complaint  Patient presents with  . Transient Ischemic Attack    Rm 7 New Pt wife- Butch Penny  "doing well since TIA on 05/06/20"    HISTORY OF PRESENT ILLNESS:   75 year old male here for evaluation of TIA.  05/06/2020 patient had 1 to 2-minute episode of slurred speech.  No to the hospital for evaluation.  TIA work-up was completed.  MRI of the brain was negative.  Patient was started on full dose aspirin and statin was continued.  He was referred to lipid clinic for better evaluation and treatment of hyperlipidemia.  Has remote history of paroxysmal atrial fibrillation in 1998.   REVIEW OF SYSTEMS: Full 14 system review of systems performed and negative with exception of: As per HPI.  ALLERGIES: Allergies  Allergen Reactions  . Contrast Media [Iodinated Diagnostic Agents]   . Sulfa Antibiotics   . Tetracyclines & Related     HOME MEDICATIONS: Outpatient Medications Prior to Visit  Medication Sig Dispense Refill  . aspirin 325 MG tablet Take 1 tablet (325 mg total) by mouth daily. 90 tablet 0  . atorvastatin (LIPITOR) 20 MG tablet Take 20 mg by mouth every evening.    . cholecalciferol (VITAMIN D3) 25 MCG (1000 UNIT) tablet Take 1,000 Units by mouth daily.    . Multiple Vitamins-Minerals (MULTIVITAMIN ADULTS 50+) TABS Take 1 tablet by mouth daily.    . vitamin C (ASCORBIC ACID) 500 MG tablet Take 500 mg by mouth daily.     No facility-administered medications prior to visit.    PAST MEDICAL HISTORY: Past Medical History:  Diagnosis Date  . A-fib ([place])   . Back pain   . Cancer [facility])    prostate  . GERD (gastroesophageal reflux disease)   . Hypertension   . Neuropathy   . Stroke ([place]) 05/06/2020   TIA  . TIA (transient ischemic attack) 05/06/2020     PAST SURGICAL HISTORY: Past Surgical History:  Procedure Laterality Date  . PROSTATE SURGERY    . TONSILLECTOMY      FAMILY HISTORY: Family History  Problem Relation Age of Onset  . Stroke Mother   . Hypertension Mother   . Prostate cancer Father     SOCIAL HISTORY: Social History   Socioeconomic History  . Marital status: Married    Spouse name: Butch Penny  . Number of children: 2  . Years of education: Not on file  . Highest education level: Master's degree (e.g., MA, MS, MEng, MEd, MSW, MBA)  Occupational History    Comment: retired  Tobacco Use  . Smoking status: Former Research scientist (life sciences)  . Smokeless tobacco: Never Used  . Tobacco comment: quit age 70  Substance and Sexual Activity  . Alcohol use: No  . Drug use: No  . Sexual activity: Not on file  Other Topics Concern  . Not on file  Social History Narrative   Lives with wife   Social Determinants of Health   Financial Resource Strain: Not on file  Food Insecurity: Not on file  Transportation Needs: Not on file  Physical Activity: Not on file  Stress: Not on file  Social Connections: Not on file  Intimate Partner Violence: Not on file     PHYSICAL EXAM  GENERAL EXAM/CONSTITUTIONAL: Vitals:  Vitals:   05/17/20 1110  BP: 132/78  Pulse:  72  Weight: 210 lb 3.2 oz (95.3 kg)  Height: 6\' 4"  (1.93 m)     Body mass index is 25.59 kg/m. Wt Readings from Last 3 Encounters:  05/17/20 210 lb 3.2 oz (95.3 kg)  05/06/20 205 lb (93 kg)     Patient is in no distress; well developed, nourished and groomed; neck is supple  CARDIOVASCULAR:  Examination of carotid arteries is normal; no carotid bruits  Regular rate and rhythm, no murmurs  Examination of peripheral vascular system by observation and palpation is normal  EYES:  Ophthalmoscopic exam of optic discs and posterior segments is normal; no papilledema or hemorrhages  No exam data present  MUSCULOSKELETAL:  Gait, strength, tone, movements noted  in Neurologic exam below  NEUROLOGIC: MENTAL STATUS:  No flowsheet data found.  awake, alert, oriented to person, place and time  recent and remote memory intact  normal attention and concentration  language fluent, comprehension intact, naming intact  fund of knowledge appropriate  CRANIAL NERVE:   2nd - no papilledema on fundoscopic exam  2nd, 3rd, 4th, 6th - pupils equal and reactive to light, visual fields full to confrontation, extraocular muscles intact, no nystagmus  5th - facial sensation symmetric  7th - facial strength symmetric  8th - hearing REDUCED  9th - palate elevates symmetrically, uvula midline  11th - shoulder shrug symmetric  12th - tongue protrusion midline  MOTOR:   normal bulk and tone, full strength in the BUE, BLE  SENSORY:   normal and symmetric to light touch, temperature, vibration  COORDINATION:   finger-nose-finger, fine finger movements normal  REFLEXES:   deep tendon reflexes present and symmetric  GAIT/STATION:   narrow based gait     DIAGNOSTIC DATA (LABS, IMAGING, TESTING) - I reviewed patient records, labs, notes, testing and imaging myself where available.  Lab Results  Component Value Date   WBC 11.4 (H) 05/07/2020   HGB 14.5 05/07/2020   HCT 42.0 05/07/2020   MCV 92.9 05/07/2020   PLT 204 05/07/2020      Component Value Date/Time   NA 140 05/07/2020 0500   K 4.1 05/07/2020 0500   CL 102 05/07/2020 0500   CO2 27 05/07/2020 0500   GLUCOSE 101 (H) 05/07/2020 0500   BUN 14 05/07/2020 0500   CREATININE 0.80 05/07/2020 0500   CALCIUM 9.6 05/07/2020 0500   PROT 7.1 05/07/2020 0500   ALBUMIN 4.2 05/07/2020 0500   AST 88 (H) 05/07/2020 0500   ALT 71 (H) 05/07/2020 0500   ALKPHOS 71 05/07/2020 0500   BILITOT 1.2 05/07/2020 0500   GFRNONAA >60 05/07/2020 0500   GFRAA >90 04/04/2014 2040   Lab Results  Component Value Date   CHOL 259 (H) 05/07/2020   HDL 31 (L) 05/07/2020   LDLCALC 191 (H) 05/07/2020    TRIG 186 (H) 05/07/2020   CHOLHDL 8.4 05/07/2020   Lab Results  Component Value Date   HGBA1C 5.6 05/07/2020   No results found for: VITAMINB12 No results found for: TSH   Workup CT head without contrast, MRI brain, MRA head, TTE and bilateral carotid ultrasound without significant finding.  LDL 191 despite good compliance with his Crestor.  A1c 5.6%.     ASSESSMENT AND PLAN  75 y.o. year old male here with:  Dx:  1. TIA (transient ischemic attack)   2. Paroxysmal atrial fibrillation (HCC)     PLAN:  TIA (slurred speech x 1-2 minutes) - continue aspirin 325mg  daily, atorvastatin; follow up lipid clinic  evaluation - check cardiac monitor (remote history of atrial fibrillation (last in 1998))  Orders Placed This Encounter  Procedures  . CARDIAC EVENT MONITOR   Return for pending if symptoms worsen or fail to improve, return to PCP.    Penni Bombard, MD 09/24/3901, 00:92 PM Certified in Neurology, Neurophysiology and Neuroimaging  [facility] [address] [place], [place] [address] [phone]

## 2020-05-17 NOTE — Patient Instructions (Signed)
TIA (slurred speech x 1-2 minutes) - continue aspirin 325mg  daily, atorvastatin; follow up lipid clinic evaluation - check cardiac monitor (remote history of atrial fibrillation (last in 1998))

## 2020-05-17 NOTE — Progress Notes (Signed)
Patient ID: Matthew Phelps, male   DOB: April 06, 1945, 75 y.o.   MRN: [id number] Patient enrolled for [employer] to ship a 30 day cardiac event monitor to his home. Letter with instructions mailed to patient.

## 2020-05-23 ENCOUNTER — Ambulatory Visit (INDEPENDENT_AMBULATORY_CARE_PROVIDER_SITE_OTHER): Payer: Medicare Other

## 2020-05-23 DIAGNOSIS — I480 Paroxysmal atrial fibrillation: Secondary | ICD-10-CM

## 2020-05-23 DIAGNOSIS — G459 Transient cerebral ischemic attack, unspecified: Secondary | ICD-10-CM

## 2020-08-01 ENCOUNTER — Encounter: Payer: Self-pay | Admitting: Internal Medicine

## 2020-08-01 ENCOUNTER — Ambulatory Visit: Payer: Medicare Other | Admitting: Internal Medicine

## 2020-08-01 ENCOUNTER — Other Ambulatory Visit: Payer: Self-pay

## 2020-08-01 VITALS — BP 124/82 | HR 79 | Ht 76.0 in | Wt 203.0 lb

## 2020-08-01 DIAGNOSIS — E785 Hyperlipidemia, unspecified: Secondary | ICD-10-CM | POA: Diagnosis not present

## 2020-08-01 DIAGNOSIS — E7800 Pure hypercholesterolemia, unspecified: Secondary | ICD-10-CM

## 2020-08-01 DIAGNOSIS — Z8679 Personal history of other diseases of the circulatory system: Secondary | ICD-10-CM | POA: Diagnosis not present

## 2020-08-01 DIAGNOSIS — Z8673 Personal history of transient ischemic attack (TIA), and cerebral infarction without residual deficits: Secondary | ICD-10-CM

## 2020-08-01 MED ORDER — ATORVASTATIN CALCIUM 40 MG PO TABS: 40.0000 mg | ORAL_TABLET | Freq: Every day | ORAL | 3 refills | Status: DC

## 2020-08-01 NOTE — Patient Instructions (Signed)
Medication Instructions:  INCREASE atorvastatin to 40mg  daily CONTINUE all other current medications  *If you need a refill on your cardiac medications before your next appointment, please call your pharmacy*   Lab Work: FASTING lab work in 3-4 months to check cholesterol  -- complete about 1 week before your next visit with Dr. Debara Pickett   If you have labs (blood work) drawn today and your tests are completely normal, you will receive your results only by: [place] MyChart Message (if you have MyChart) OR . A paper copy in the mail If you have any lab test that is abnormal or we need to change your treatment, we will call you to review the results.   Testing/Procedures: NONE   Follow-Up: At [facility], you and your health needs are our priority.  As part of our continuing mission to provide you with exceptional heart care, we have created designated Provider Care Teams.  These Care Teams include your primary Cardiologist (physician) and Advanced Practice Providers (APPs -  Physician Assistants and Nurse Practitioners) who all work together to provide you with the care you need, when you need it.  We recommend signing up for the patient portal called "MyChart".  Sign up information is provided on this After Visit Summary.  MyChart is used to connect with patients for Virtual Visits (Telemedicine).  Patients are able to view lab/test results, encounter notes, upcoming appointments, etc.  Non-urgent messages can be sent to your provider as well.   To learn more about what you can do with MyChart, go to [email or web address].    Your next appointment:   3-4 month(s) - lipid clinic  The format for your next appointment:   In Person  Provider:   K. Mali Hilty, MD   Other Instructions

## 2020-08-01 NOTE — Progress Notes (Signed)
LIPID CLINIC CONSULT NOTE  Chief Complaint:  TIA, manage dyslipidemia  Primary Care Physician: Ellene Route  Primary Cardiologist:  None  HPI:  Matthew Phelps is a 75 y.o. male who is being seen today for the evaluation of TIA, manage dyslipidemia at the request of Mercy Riding, MD.  This is a pleasant 75 year old male kindly referred for evaluation management of dyslipidemia but who also had a history of TIA/possible stroke.  There was no clear imaging evidence however of stroke and he has been seen by Dr. Leta Baptist with neurology.  He had MRI and CT of the brain.  He also wore a monitor which I reviewed and demonstrated no evidence of atrial fibrillation.  Matthew Phelps says that he does have a history of A. fib however it was more than 20 years ago.  He had a couple episodes and was possibly on an antiarrhythmic medication as well as warfarin for about a month and then they were discontinued.  I suspect may be because this was considered low risk at the time.  More recently though he had lipids which showed an LDL cholesterol greater than 190 in February.  He was started on atorvastatin 20 mg daily but previously had been on 40 mg of simvastatin.  Repeat lipids in March showed total cholesterol 224, triglycerides 233, HDL 32 and LDL 146.  His target LDL is less than 70.  He denies any recurrent palpitations.  He has had no more stroke or TIA episodes.  He does report kind of a varied diet but tries to avoid saturated fats and red meats particularly.  PMHx:  Past Medical History:  Diagnosis Date  . A-fib ([place])   . Back pain   . Cancer [facility])    prostate  . GERD (gastroesophageal reflux disease)   . Hypertension   . Neuropathy   . Stroke ([place]) 05/06/2020   TIA  . TIA (transient ischemic attack) 05/06/2020    Past Surgical History:  Procedure Laterality Date  . PROSTATE SURGERY    . TONSILLECTOMY      FAMHx:  Family History  Problem Relation Age of Onset  .  Stroke Mother   . Hypertension Mother   . Prostate cancer Father     SOCHx:   reports that he has quit smoking. He has never used smokeless tobacco. He reports that he does not drink alcohol and does not use drugs.  ALLERGIES:  Allergies  Allergen Reactions  . Contrast Media [Iodinated Diagnostic Agents]   . Sulfa Antibiotics   . Tetracyclines & Related     ROS: Pertinent items noted in HPI and remainder of comprehensive ROS otherwise negative.  HOME MEDS: Current Outpatient Medications on File Prior to Visit  Medication Sig Dispense Refill  . aspirin 325 MG tablet Take 1 tablet (325 mg total) by mouth daily. 90 tablet 0  . atorvastatin (LIPITOR) 20 MG tablet Take 20 mg by mouth every evening.    . cholecalciferol (VITAMIN D3) 25 MCG (1000 UNIT) tablet Take 1,000 Units by mouth daily.    . Multiple Vitamins-Minerals (MULTIVITAMIN ADULTS 50+) TABS Take 1 tablet by mouth daily.    . vitamin C (ASCORBIC ACID) 500 MG tablet Take 500 mg by mouth daily.     No current facility-administered medications on file prior to visit.    LABS/IMAGING: No results found for this or any previous visit (from the past 48 hour(s)). No results found.  LIPID PANEL:    Component Value  Date/Time   CHOL 259 (H) 05/07/2020 0500   TRIG 186 (H) 05/07/2020 0500   HDL 31 (L) 05/07/2020 0500   CHOLHDL 8.4 05/07/2020 0500   VLDL 37 05/07/2020 0500   LDLCALC 191 (H) 05/07/2020 0500    WEIGHTS: Wt Readings from Last 3 Encounters:  08/01/20 203 lb (92.1 kg)  05/17/20 210 lb 3.2 oz (95.3 kg)  05/06/20 205 lb (93 kg)    VITALS: BP 124/82   Pulse 79   Ht 6\' 4"  (1.93 m)   Wt 203 lb (92.1 kg)   SpO2 94%   BMI 24.71 kg/m   EXAM: General appearance: alert and no distress Neck: no carotid bruit, no JVD and thyroid not enlarged, symmetric, no tenderness/mass/nodules Lungs: clear to auscultation bilaterally Heart: regular rate and rhythm, S1, S2 normal, no murmur, click, rub or gallop Abdomen:  soft, non-tender; bowel sounds normal; no masses,  no organomegaly Extremities: extremities normal, atraumatic, no cyanosis or edema Pulses: 2+ and symmetric Skin: Skin color, texture, turgor normal. No rashes or lesions Neurologic: Grossly normal Psych: Pleasant  EKG: Deferred  ASSESSMENT: 1. Recent TIA, goal LDL <70 2. 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 Family history of stroke in his mother 83. Remote history of atrial fibrlilation  PLAN: 1.   Matthew Phelps had a recent TIA and there is a family history of stroke in his mother.  His cholesterol was quite high with an LDL over 190.  He has been started on 20 mg of atorvastatin but previously was on simvastatin 40 mg.  The cholesterol has come down but he remains well above his target LDL less than 70.  I would advise based on current guidelines to increase his atorvastatin from 20 to 40 mg which will help the probably not get him to target.  Ultimately he may need a PCSK9 inhibitor.  I like to repeat labs including a lipid NMR and LP(a) in about 3 months to look for other possible causes of a stroke.  He does have a remote history of atrial fibrillation.  None was detected during recent monitoring however his episodes may be very infrequent.  I have encouraged him to get a cardia mobile device in order to assess for recurrent atrial fibrillation if he has recurrent palpitations.  Plan follow-up with me in 3 to 4 months.  Thanks for the kind referral.  Matthew Casino, MD, FACC, [place] Director of the Advanced Lipid Disorders &  Cardiovascular Risk Reduction Clinic Diplomate of the American Board of Clinical Lipidology Attending Cardiologist  Direct Dial: [phone]  Fax: [phone]  Website:  [email or web address] 08/01/2020, 2:41 PM

## 2020-10-28 ENCOUNTER — Encounter (HOSPITAL_BASED_OUTPATIENT_CLINIC_OR_DEPARTMENT_OTHER): Payer: Self-pay | Admitting: Internal Medicine

## 2020-10-28 ENCOUNTER — Ambulatory Visit (HOSPITAL_BASED_OUTPATIENT_CLINIC_OR_DEPARTMENT_OTHER): Payer: Medicare Other | Admitting: Internal Medicine

## 2020-10-28 ENCOUNTER — Other Ambulatory Visit: Payer: Self-pay

## 2020-10-28 VITALS — BP 126/70 | HR 67 | Ht 76.0 in | Wt 200.0 lb

## 2020-10-28 DIAGNOSIS — Z8673 Personal history of transient ischemic attack (TIA), and cerebral infarction without residual deficits: Secondary | ICD-10-CM

## 2020-10-28 DIAGNOSIS — Z8679 Personal history of other diseases of the circulatory system: Secondary | ICD-10-CM | POA: Diagnosis not present

## 2020-10-28 DIAGNOSIS — E785 Hyperlipidemia, unspecified: Secondary | ICD-10-CM

## 2020-10-28 NOTE — Patient Instructions (Signed)
Medication Instructions:  Dr. Debara Pickett recommends REPATHA '140MG'$ /ML OR PRALUENT '150MG'$ /ML -- this an injectable cholesterol medication, self-administered once every 14 days in belly or outer thigh (fatty tissues)   *If you need a refill on your cardiac medications before your next appointment, please call your pharmacy*   Lab Work: FASTING lab work in 3-4 months to check cholesterol  -- NMR lipoprofile  -- Lipoprotein A  If you have labs (blood work) drawn today and your tests are completely normal, you will receive your results only by: [place] (if you have MyChart) OR A paper copy in the mail If you have any lab test that is abnormal or we need to change your treatment, we will call you to review the results.  Follow-Up: At [facility], you and your health needs are our priority.  As part of our continuing mission to provide you with exceptional heart care, we have created designated Provider Care Teams.  These Care Teams include your primary Cardiologist (physician) and Advanced Practice Providers (APPs -  Physician Assistants and Nurse Practitioners) who all work together to provide you with the care you need, when you need it.  We recommend signing up for the patient portal called "MyChart".  Sign up information is provided on this After Visit Summary.  MyChart is used to connect with patients for Virtual Visits (Telemedicine).  Patients are able to view lab/test results, encounter notes, upcoming appointments, etc.  Non-urgent messages can be sent to your provider as well.   To learn more about what you can do with MyChart, go to [email or web address].    Your next appointment:   4 month(s) - lipid clinic  The format for your next appointment:   In Person  Provider:   K. Mali Hilty, MD   Other Instructions

## 2020-10-28 NOTE — Progress Notes (Signed)
LIPID CLINIC CONSULT NOTE  Chief Complaint:  TIA, manage dyslipidemia  Primary Care Physician: Juluis Pitch, MD  Primary Cardiologist:  None  HPI:  Matthew Phelps is a 75 y.o. male who is being seen today for the evaluation of TIA, manage dyslipidemia at the request of [place], Jefm Bryant.  This is a pleasant 75 year old male kindly referred for evaluation management of dyslipidemia but who also had a history of TIA/possible stroke.  There was no clear imaging evidence however of stroke and he has been seen by Dr. Leta Baptist with neurology.  He had MRI and CT of the brain.  He also wore a monitor which I reviewed and demonstrated no evidence of atrial fibrillation.  [name] says that he does have a history of A. fib however it was more than 20 years ago.  He had a couple episodes and was possibly on an antiarrhythmic medication as well as warfarin for about a month and then they were discontinued.  I suspect may be because this was considered low risk at the time.  More recently though he had lipids which showed an LDL cholesterol greater than 190 in February.  He was started on atorvastatin 20 mg daily but previously had been on 40 mg of simvastatin.  Repeat lipids in March showed total cholesterol 224, triglycerides 233, HDL 32 and LDL 146.  His target LDL is less than 70.  He denies any recurrent palpitations.  He has had no more stroke or TIA episodes.  He does report kind of a varied diet but tries to avoid saturated fats and red meats particularly.  10/28/2020  [name] returns today for follow-up.  Although I increased his statin further up to 40 mg of atorvastatin, his lipids are not necessarily much improved.  Total cholesterol now 217, triglycerides 195, HDL 36 and LDL 142.  His target LDL is less than 70 based on prior TIA event.  We discussed this previously that he would likely need additional therapy to reach that target.  PMHx:  Past Medical History:  Diagnosis  Date   A-fib ([place])    Back pain    Cancer ([place])    prostate   GERD (gastroesophageal reflux disease)    Hypertension    Neuropathy    Stroke ([place]) 05/06/2020   TIA   TIA (transient ischemic attack) 05/06/2020    Past Surgical History:  Procedure Laterality Date   PROSTATE SURGERY     TONSILLECTOMY      FAMHx:  Family History  Problem Relation Age of Onset   Stroke Mother    Hypertension Mother    Prostate cancer Father     SOCHx:   reports that he has quit smoking. He has never used smokeless tobacco. He reports that he does not drink alcohol and does not use drugs.  ALLERGIES:  Allergies  Allergen Reactions   Contrast Media [Iodinated Diagnostic Agents]    Sulfa Antibiotics    Tetracyclines & Related     ROS: Pertinent items noted in HPI and remainder of comprehensive ROS otherwise negative.  HOME MEDS: Current Outpatient Medications on File Prior to Visit  Medication Sig Dispense Refill   aspirin 325 MG tablet Take 1 tablet (325 mg total) by mouth daily. 90 tablet 0   atorvastatin (LIPITOR) 40 MG tablet Take 1 tablet (40 mg total) by mouth daily. 90 tablet 3   cholecalciferol (VITAMIN D3) 25 MCG (1000 UNIT) tablet Take 1,000 Units by mouth daily.  Multiple Vitamins-Minerals (MULTIVITAMIN ADULTS 50+) TABS Take 1 tablet by mouth daily.     vitamin C (ASCORBIC ACID) 500 MG tablet Take 500 mg by mouth daily.     No current facility-administered medications on file prior to visit.    LABS/IMAGING: No results found for this or any previous visit (from the past 48 hour(s)). No results found.  LIPID PANEL:    Component Value Date/Time   CHOL 259 (H) 05/07/2020 0500   TRIG 186 (H) 05/07/2020 0500   HDL 31 (L) 05/07/2020 0500   CHOLHDL 8.4 05/07/2020 0500   VLDL 37 05/07/2020 0500   LDLCALC 191 (H) 05/07/2020 0500    WEIGHTS: Wt Readings from Last 3 Encounters:  10/28/20 200 lb (90.7 kg)  08/01/20 203 lb (92.1 kg)  05/17/20 210 lb 3.2 oz (95.3 kg)     VITALS: BP 126/70 (BP Location: Left Arm, Patient Position: Sitting, Cuff Size: Normal)   Pulse 67   Ht '6\' 4"'$  (1.93 m)   Wt 200 lb (90.7 kg)   SpO2 96%   BMI 24.34 kg/m   EXAM: Deferred  EKG: Deferred  ASSESSMENT: Recent TIA, goal LDL <70 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   Mr. Machon is about 50% higher than his LDL goal on high potency atorvastatin.  Despite an increase from 20 to 40 mg he needs further reduction given his prior TIA.  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  I would recommend adding Repatha 140 mg every 2 weeks.  We will plan repeat lipids in about 3 to 4 months and follow-up at that time.  Pixie Casino, MD, [facility], [place] Director of the Advanced Lipid Disorders &  Cardiovascular Risk Reduction Clinic Diplomate of the American Board of Clinical Lipidology Attending Cardiologist  Direct Dial: [phone]  Fax: [phone]  Website:  [email or web address] 10/28/2020, 4:41 PM

## 2020-11-22 ENCOUNTER — Telehealth: Payer: Self-pay | Admitting: Internal Medicine

## 2020-11-22 NOTE — Telephone Encounter (Signed)
Pt reaching out to advise that he is ready to begin the injectable medication Repatha he discussed with the nurse, please advise.

## 2020-12-23 ENCOUNTER — Encounter (HOSPITAL_BASED_OUTPATIENT_CLINIC_OR_DEPARTMENT_OTHER): Payer: Self-pay

## 2020-12-23 NOTE — Telephone Encounter (Signed)
Please advise. Thank you

## 2020-12-23 NOTE — Telephone Encounter (Signed)
Thanks Lovena Le -we'll look into what happened with the prescription - he would be advised to take the PCSK9 inhibitor with the statin together.  Dr. Debara Pickett

## 2020-12-26 ENCOUNTER — Telehealth: Payer: Self-pay | Admitting: Internal Medicine

## 2020-12-26 NOTE — Telephone Encounter (Signed)
PA for repatha submitted via [employer] (Key: [id number])

## 2020-12-27 MED ORDER — REPATHA SURECLICK 140 MG/ML ~~LOC~~ SOAJ: 1.0000 | SUBCUTANEOUS | 11 refills | Status: DC

## 2020-12-27 NOTE — Addendum Note (Signed)
Addended by: Fidel Levy on: 12/27/2020 08:58 AM   Modules accepted: Orders

## 2020-12-27 NOTE — Telephone Encounter (Signed)
PA approved Effective from 12/26/2020 through 12/26/2021

## 2021-03-28 ENCOUNTER — Other Ambulatory Visit
Admission: RE | Admit: 2021-03-28 | Discharge: 2021-03-28 | Disposition: A | Discharge status: Home / Self Care | Payer: Medicare Other | Attending: Internal Medicine | Admitting: Internal Medicine

## 2021-03-28 DIAGNOSIS — E785 Hyperlipidemia, unspecified: Secondary | ICD-10-CM | POA: Insufficient documentation

## 2021-03-29 LAB — NMR, LIPOPROFILE
Cholesterol, Total: 106 mg/dL (ref 100–199)
HDL Cholesterol by NMR: 39 mg/dL — ABNORMAL LOW (ref >39)
HDL Particle Number: 29.2 umol/L — ABNORMAL LOW (ref >=30.5)
LDL Particle Number: 454 nmol/L (ref <1000)
LDL Size: 19.7 nm — ABNORMAL LOW (ref >20.5)
LDL-C (NIH Calc): 35 mg/dL (ref 0–99)
LP-IR Score: 55 — ABNORMAL HIGH (ref <=45)
Small LDL Particle Number: 324 nmol/L (ref <=527)
Triglycerides by NMR: 200 mg/dL — ABNORMAL HIGH (ref 0–149)

## 2021-03-29 LAB — LIPOPROTEIN A (LPA): Lipoprotein (a): <8.4 nmol/L (ref <75.0)

## 2021-04-04 ENCOUNTER — Other Ambulatory Visit: Payer: Self-pay

## 2021-04-04 ENCOUNTER — Ambulatory Visit (HOSPITAL_BASED_OUTPATIENT_CLINIC_OR_DEPARTMENT_OTHER): Payer: Medicare Other | Admitting: Internal Medicine

## 2021-04-04 ENCOUNTER — Encounter (HOSPITAL_BASED_OUTPATIENT_CLINIC_OR_DEPARTMENT_OTHER): Payer: Self-pay | Admitting: Internal Medicine

## 2021-04-04 VITALS — BP 120/72 | HR 76 | Ht 76.0 in | Wt 204.5 lb

## 2021-04-04 DIAGNOSIS — Z8673 Personal history of transient ischemic attack (TIA), and cerebral infarction without residual deficits: Secondary | ICD-10-CM

## 2021-04-04 DIAGNOSIS — E785 Hyperlipidemia, unspecified: Secondary | ICD-10-CM | POA: Diagnosis not present

## 2021-04-04 NOTE — Patient Instructions (Signed)
Medication Instructions:  Your physician recommends that you continue on your current medications as directed. Please refer to the Current Medication list given to you today.  *If you need a refill on your cardiac medications before your next appointment, please call your pharmacy*   Lab Work: FASTING lab work to check cholesterol in 1 year -- complete 1 week before your next appointment   If you have labs (blood work) drawn today and your tests are completely normal, you will receive your results only by: [place] (if you have MyChart) OR A paper copy in the mail If you have any lab test that is abnormal or we need to change your treatment, we will call you to review the results.   Follow-Up: At [facility], you and your health needs are our priority.  As part of our continuing mission to provide you with exceptional heart care, we have created designated Provider Care Teams.  These Care Teams include your primary Cardiologist (physician) and Advanced Practice Providers (APPs -  Physician Assistants and Nurse Practitioners) who all work together to provide you with the care you need, when you need it.  We recommend signing up for the patient portal called "MyChart".  Sign up information is provided on this After Visit Summary.  MyChart is used to connect with patients for Virtual Visits (Telemedicine).  Patients are able to view lab/test results, encounter notes, upcoming appointments, etc.  Non-urgent messages can be sent to your provider as well.   To learn more about what you can do with MyChart, go to [email or web address].    Your next appointment:   1 year with Dr. Debara Pickett -- lipid clinic

## 2021-04-04 NOTE — Progress Notes (Signed)
LIPID CLINIC CONSULT NOTE  Chief Complaint:  Follow-up TIA, manage dyslipidemia  Primary Care Physician: Juluis Pitch, MD  Primary Cardiologist:  None  HPI:  Matthew Phelps is a 76 y.o. male who is being seen today for the evaluation of TIA, manage dyslipidemia at the request of Juluis Pitch, MD.  This is a pleasant 76 year old male kindly referred for evaluation management of dyslipidemia but who also had a history of TIA/possible stroke.  There was no clear imaging evidence however of stroke and he has been seen by Dr. Leta Baptist with neurology.  He had MRI and CT of the brain.  He also wore a monitor which I reviewed and demonstrated no evidence of atrial fibrillation.  Matthew Phelps says that he does have a history of A. fib however it was more than 20 years ago.  He had a couple episodes and was possibly on an antiarrhythmic medication as well as warfarin for about a month and then they were discontinued.  I suspect may be because this was considered low risk at the time.  More recently though he had lipids which showed an LDL cholesterol greater than 190 in February.  He was started on atorvastatin 20 mg daily but previously had been on 40 mg of simvastatin.  Repeat lipids in March showed total cholesterol 224, triglycerides 233, HDL 32 and LDL 146.  His target LDL is less than 70.  He denies any recurrent palpitations.  He has had no more stroke or TIA episodes.  He does report kind of a varied diet but tries to avoid saturated fats and red meats particularly.  10/28/2020  Matthew Phelps returns today for follow-up.  Although I increased his statin further up to 40 mg of atorvastatin, his lipids are not necessarily much improved.  Total cholesterol now 217, triglycerides 195, HDL 36 and LDL 142.  His target LDL is less than 70 based on prior TIA event.  We discussed this previously that he would likely need additional therapy to reach that target.  04/04/2021  Matthew Phelps returns  today for follow-up.  He has had a significant response to Repatha with marked improvement in his lipids.  Total cholesterol now 106, HDL 31, triglycerides and LDL of 35.  He seems to be tolerating well and it was very pleased to see the improvement in his numbers.  He did have an LP(a) which was negative.  PMHx:  Past Medical History:  Diagnosis Date   A-fib ([place])    Back pain    Cancer ([place])    prostate   GERD (gastroesophageal reflux disease)    Hypertension    Neuropathy    Stroke ([place]) 05/06/2020   TIA   TIA (transient ischemic attack) 05/06/2020    Past Surgical History:  Procedure Laterality Date   PROSTATE SURGERY     TONSILLECTOMY      FAMHx:  Family History  Problem Relation Age of Onset   Stroke Mother    Hypertension Mother    Prostate cancer Father     SOCHx:   reports that he has quit smoking. He has never used smokeless tobacco. He reports that he does not drink alcohol and does not use drugs.  ALLERGIES:  Allergies  Allergen Reactions   Contrast Media [Iodinated Contrast Media]    Sulfa Antibiotics    Tetracyclines & Related     ROS: Pertinent items noted in HPI and remainder of comprehensive ROS otherwise negative.  HOME MEDS: Current Outpatient Medications  on File Prior to Visit  Medication Sig Dispense Refill   albuterol (VENTOLIN HFA) 108 (90 Base) MCG/ACT inhaler SMARTSIG:2 Puff(s) By Mouth Every 4-6 Hours PRN     aspirin 325 MG tablet Take 1 tablet (325 mg total) by mouth daily. 90 tablet 0   atorvastatin (LIPITOR) 40 MG tablet Take 1 tablet (40 mg total) by mouth daily. 90 tablet 3   cholecalciferol (VITAMIN D3) 25 MCG (1000 UNIT) tablet Take 1,000 Units by mouth daily.     Evolocumab (REPATHA SURECLICK) 712 MG/ML SOAJ Inject 1 Dose into the skin every 14 (fourteen) days. 2 mL 11   Multiple Vitamins-Minerals (MULTIVITAMIN ADULTS 50+) TABS Take 1 tablet by mouth daily.     vitamin C (ASCORBIC ACID) 500 MG tablet Take 500 mg by mouth daily.      No current facility-administered medications on file prior to visit.    LABS/IMAGING: No results found for this or any previous visit (from the past 48 hour(s)). No results found.  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   Matthew Phelps had significant improvement in his lipids on Repatha.  He is tolerating this well.  He is now well below target.  He is also made some dietary changes.  I would advise continue his current therapies.  Although his triglycerides are little high overall I do not think that is contributing a lot to his residual risk.  We will continue with his current therapy.  Plan repeat lipids and follow-up with me in a year or sooner as necessary.  We will reauthorize his Repatha now.  Pixie Casino, MD, [facility], [place] Director of the Advanced Lipid Disorders &  Cardiovascular Risk Reduction Clinic Diplomate of the American Board of Clinical Lipidology Attending Cardiologist  Direct Dial: [phone]   Fax: [phone]  Website:  [email or web address] 04/04/2021, 3:39 PM

## 2021-08-09 ENCOUNTER — Other Ambulatory Visit: Payer: Self-pay | Admitting: Internal Medicine

## 2021-11-04 IMAGING — CT CT HEAD W/O CM
3 series · 16 of 47 positions shown, 19 images · non-contrast
Comparison: Head CT 07/03/2006

CLINICAL DATA: Recent episode of slurred speech and word-finding
difficulty.

EXAM:
CT HEAD WITHOUT CONTRAST
TECHNIQUE: Contiguous axial images were obtained from the base of the skull
through the vertex without intravenous contrast.

[Series 2: head wo · axial · 0.47mm/px · z∈[-164,-19]mm · 10 of 35 slices shown, 13 images]
[im 3/35  brain]
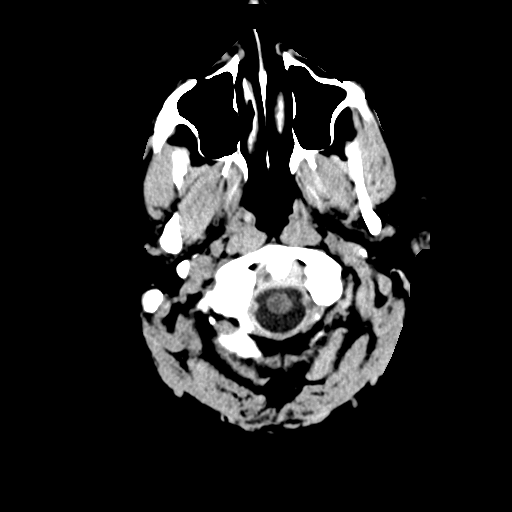
[im 3/35  bone]
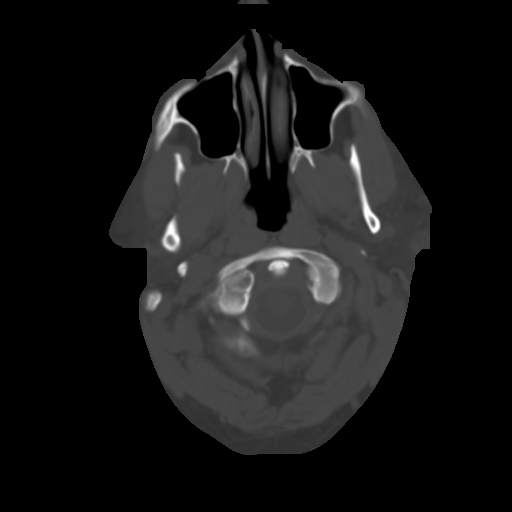
[im 6/35  brain]
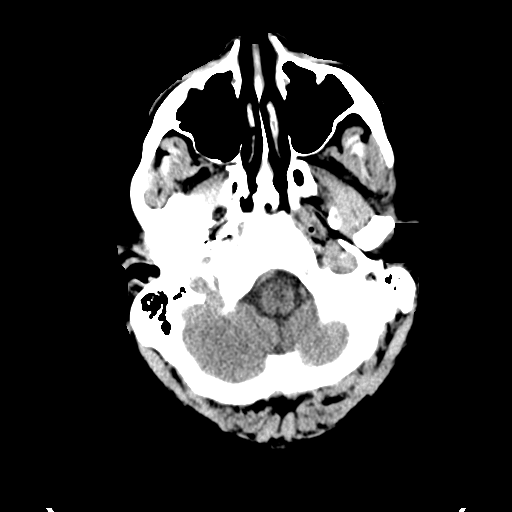
[im 10/35  brain]
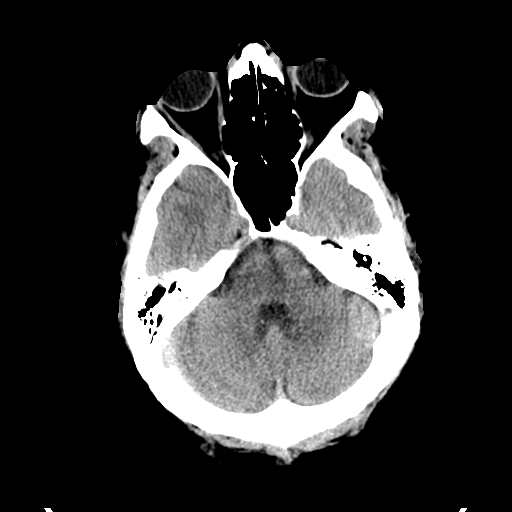
[im 12/35  brain]
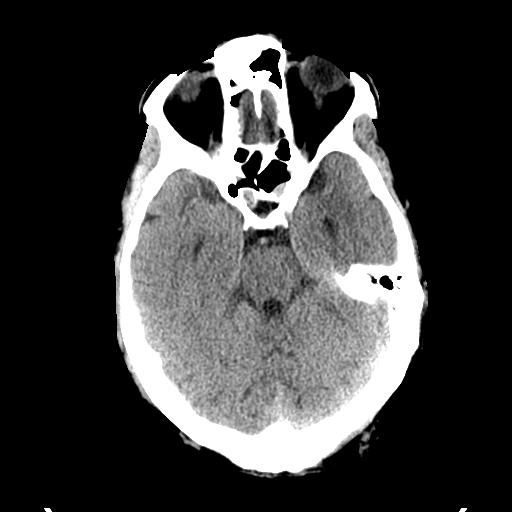
[im 16/35  brain]
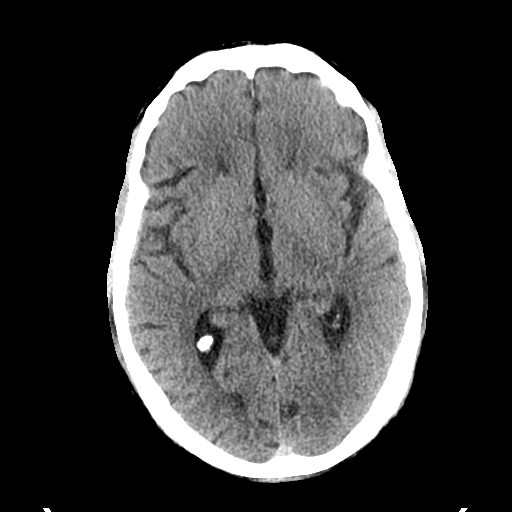
[im 16/35  bone]
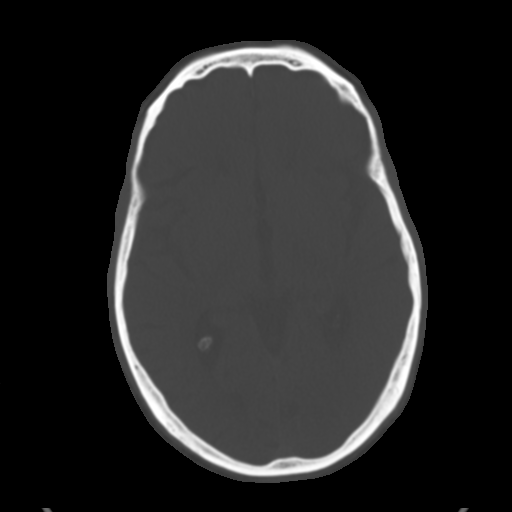
[im 19/35  brain]
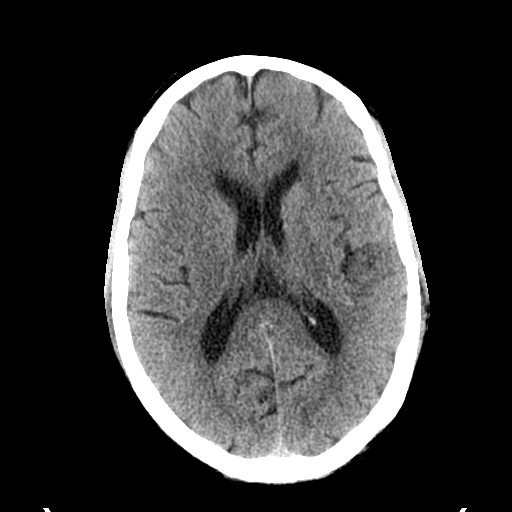
[im 23/35  brain]
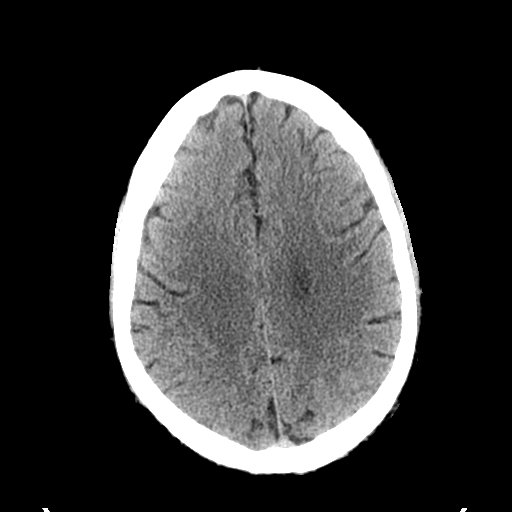
[im 26/35  brain]
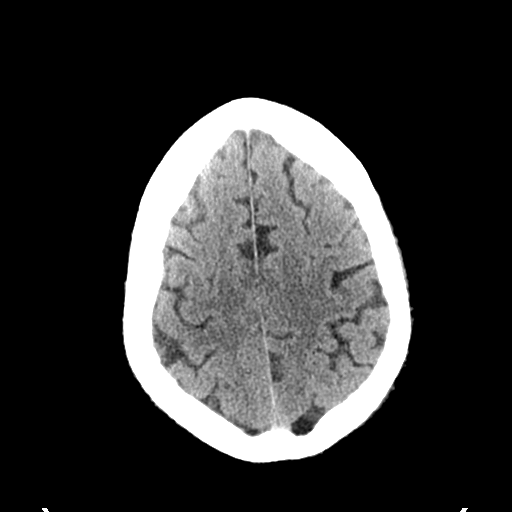
[im 29/35  brain]
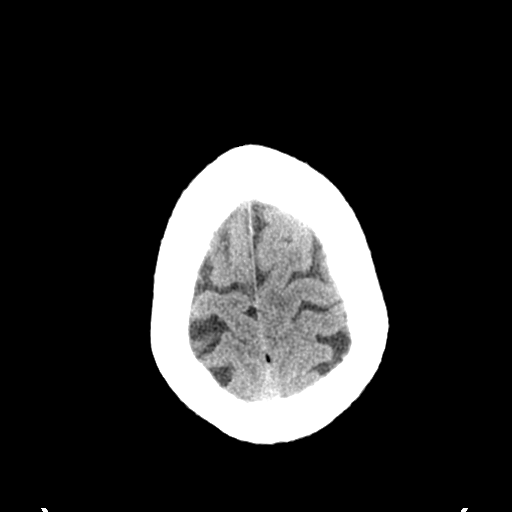
[im 29/35  bone]
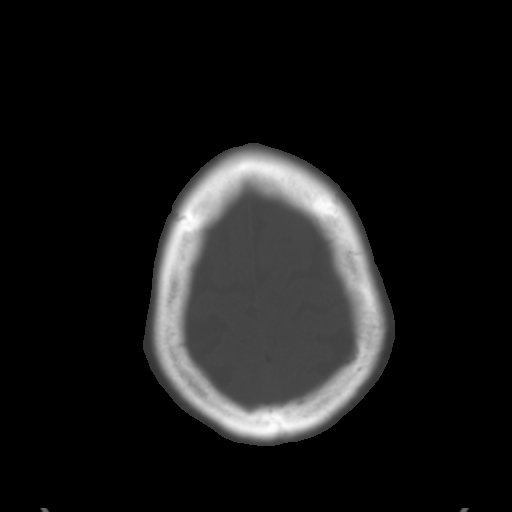
[im 32/35  brain]
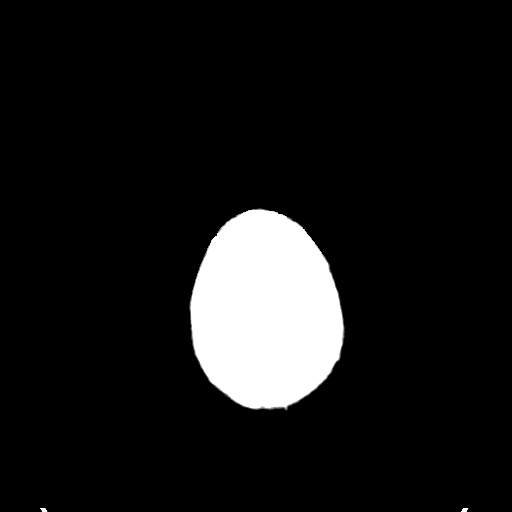

[Series 4: coronal soft tissue · coronal · 0.34mm/px · 3 of 79 slices shown]
[im 27/79  brain]
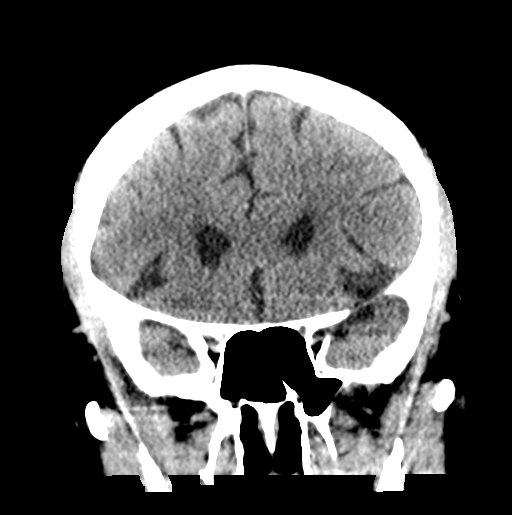
[im 35/79  brain]
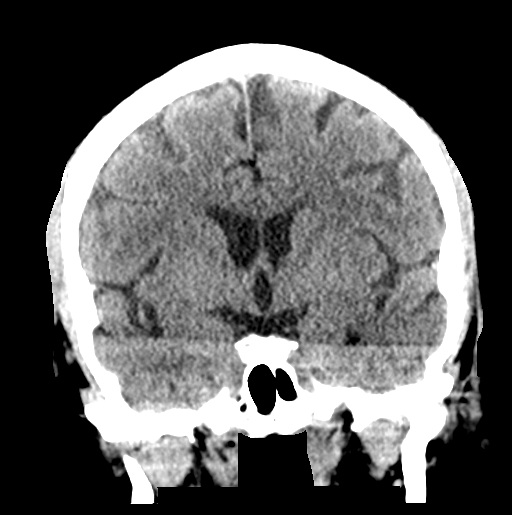
[im 44/79  brain]
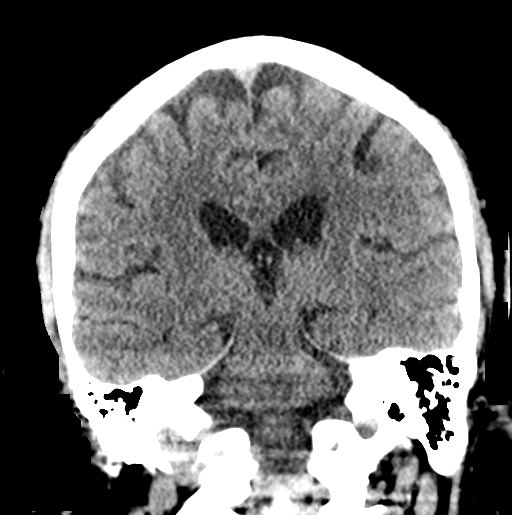

[Series 5: sagittal soft tissue · sagittal · 0.38mm/px · 3 of 61 slices shown]
[im 21/61  brain]
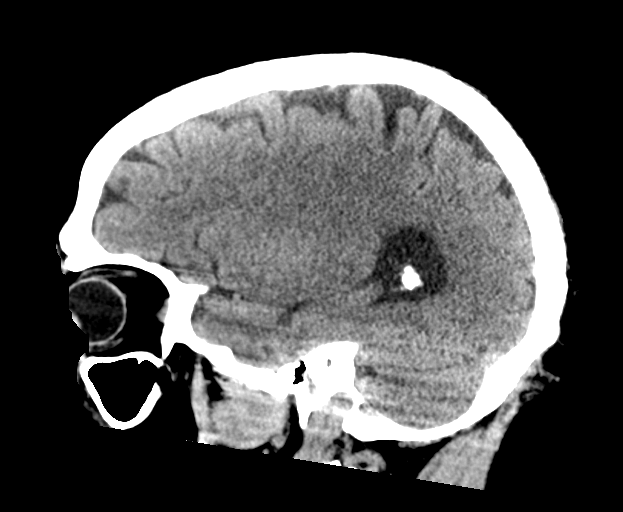
[im 31/61  brain]
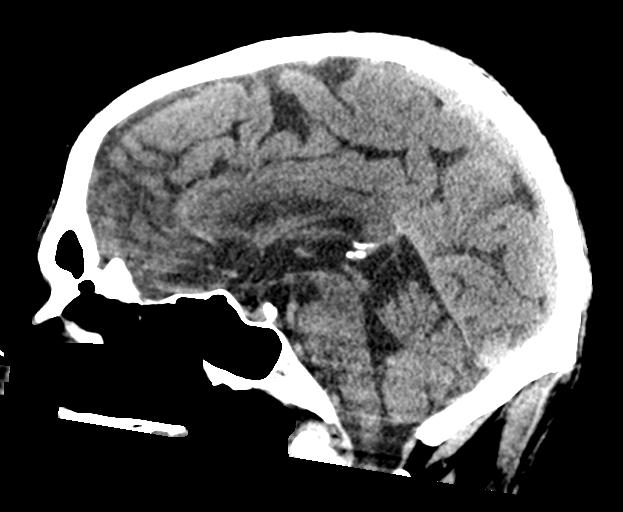
[im 41/61  brain]
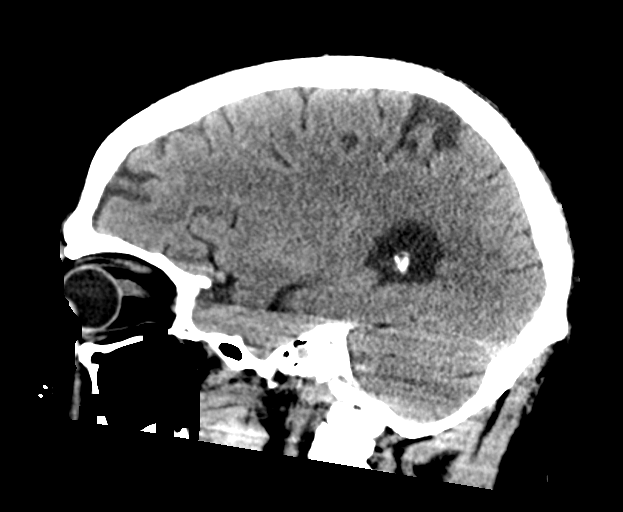

[16 of 47 positions shown; findings below may reference images not displayed]

FINDINGS: Brain: There is no mass, hemorrhage or extra-axial collection. The
size and configuration of the ventricles and extra-axial CSF spaces
are normal. The brain parenchyma is normal, without acute or chronic
infarction.

Vascular: No abnormal hyperdensity of the major intracranial
arteries or dural venous sinuses. No intracranial atherosclerosis.

Skull: The visualized skull base, calvarium and extracranial soft
tissues are normal.

Sinuses/Orbits: No fluid levels or advanced mucosal thickening of
the visualized paranasal sinuses. No mastoid or middle ear effusion.
The orbits are normal.
IMPRESSION: Normal head CT.

## 2021-12-19 ENCOUNTER — Telehealth: Payer: Self-pay | Admitting: Internal Medicine

## 2021-12-19 NOTE — Telephone Encounter (Signed)
PA submitted for Repatha via [employer] (Key: [id number])

## 2021-12-20 NOTE — Telephone Encounter (Signed)
Approved  Effective from 12/19/2021 through 12/20/2022

## 2022-01-05 ENCOUNTER — Other Ambulatory Visit: Payer: Self-pay | Admitting: Internal Medicine

## 2022-01-05 DIAGNOSIS — Z8673 Personal history of transient ischemic attack (TIA), and cerebral infarction without residual deficits: Secondary | ICD-10-CM

## 2022-01-05 DIAGNOSIS — E785 Hyperlipidemia, unspecified: Secondary | ICD-10-CM

## 2022-02-02 ENCOUNTER — Other Ambulatory Visit: Payer: Self-pay | Admitting: *Deleted

## 2022-02-02 DIAGNOSIS — E785 Hyperlipidemia, unspecified: Secondary | ICD-10-CM

## 2022-03-30 ENCOUNTER — Other Ambulatory Visit: Payer: Self-pay | Admitting: Internal Medicine

## 2022-03-30 DIAGNOSIS — Z8673 Personal history of transient ischemic attack (TIA), and cerebral infarction without residual deficits: Secondary | ICD-10-CM

## 2022-03-30 DIAGNOSIS — E785 Hyperlipidemia, unspecified: Secondary | ICD-10-CM

## 2022-04-03 ENCOUNTER — Other Ambulatory Visit: Payer: Self-pay | Admitting: Internal Medicine

## 2022-04-03 DIAGNOSIS — E785 Hyperlipidemia, unspecified: Secondary | ICD-10-CM

## 2022-04-03 DIAGNOSIS — Z8673 Personal history of transient ischemic attack (TIA), and cerebral infarction without residual deficits: Secondary | ICD-10-CM

## 2022-04-03 NOTE — Telephone Encounter (Signed)
Patient due for yearly lipid clinic f/u with MD

## 2022-04-03 NOTE — Telephone Encounter (Signed)
Forwarded to [employer]

## 2022-05-18 ENCOUNTER — Other Ambulatory Visit: Payer: Self-pay | Admitting: Internal Medicine

## 2022-06-29 ENCOUNTER — Other Ambulatory Visit: Payer: Self-pay

## 2022-06-29 ENCOUNTER — Emergency Department: Payer: Medicare Other

## 2022-06-29 ENCOUNTER — Encounter: Payer: Self-pay | Admitting: Intensive Care

## 2022-06-29 ENCOUNTER — Emergency Department
Admission: EM | Admit: 2022-06-29 | Discharge: 2022-06-29 | Disposition: A | Discharge status: Home / Self Care | Payer: Medicare Other | Attending: Emergency Medicine | Admitting: Emergency Medicine

## 2022-06-29 DIAGNOSIS — M25572 Pain in left ankle and joints of left foot: Secondary | ICD-10-CM | POA: Diagnosis present

## 2022-06-29 DIAGNOSIS — I10 Essential (primary) hypertension: Secondary | ICD-10-CM | POA: Insufficient documentation

## 2022-06-29 DIAGNOSIS — I4891 Unspecified atrial fibrillation: Secondary | ICD-10-CM | POA: Insufficient documentation

## 2022-06-29 NOTE — ED Triage Notes (Signed)
Patient presents with left ankle injury. Happened about a week ago, went to stand and foot was asleep and reports twisting ankle.

## 2022-06-29 NOTE — Discharge Instructions (Signed)
You have a ankle sprain.  You should wear the boot for at least 1 week.  Follow-up with orthopedics.  If you do not have a orthopedist you may follow-up at [facility].  Elevate and ice the left ankle.  Take Tylenol or ibuprofen for pain as needed.

## 2022-06-29 NOTE — ED Provider Notes (Signed)
[facility] Provider Note    Event Date/Time   First MD Initiated Contact with Patient 06/29/22 1334     (approximate)   History   Ankle Pain   HPI  Matthew Phelps is a 77 y.o. male with history of hypertension, A-fib CVA presents emergency department with left ankle pain.  Patient states he twisted left ankle 4 days ago.  This happened pain when bearing weight.  Tried to use a cane without any relief.  States the area is a little red with swelling and bruising.      Physical Exam   Triage Vital Signs: ED Triage Vitals  Enc Vitals Group     BP --      Pulse --      Resp --      Temp --      Temp src --      SpO2 --      Weight 06/29/22 1313 200 lb (90.7 kg)     Height 06/29/22 1313  (1.93 m)     Head Circumference --      Peak Flow --      Pain Score 06/29/22 1312 3     Pain Loc --      Pain Edu? --      Excl. in GC? --     Most recent vital signs: Vitals:   06/29/22 1424  BP: (!) 102/54  Pulse: 65  Resp: 16  Temp: 97.9 F (36.6 C)  SpO2: 94%     General: Awake, no distress.   CV:  Good peripheral perfusion. regular rate and  rhythm Resp:  Normal effort.  Abd:  No distention.   Other:  Left ankle swollen tender warm to touch, bruising noted at the distal portion of the ankle, fifth metatarsal and foot are nontender, neurovascular is intact   ED Results / Procedures / Treatments   Labs (all labs ordered are listed, but only abnormal results are displayed) Labs Reviewed - No data to display   EKG     RADIOLOGY X-ray of the left ankle    PROCEDURES:   Procedures   MEDICATIONS ORDERED IN ED: Medications - No data to display   IMPRESSION / MDM / ASSESSMENT AND PLAN / ED COURSE  I reviewed the triage vital signs and the nursing notes.                              Differential diagnosis includes, but is not limited to, sprain, fracture, cellulitis  Patient's presentation is most consistent with  acute complicated illness / injury requiring diagnostic workup.   X-ray of the left ankle  I did independently review and interpret the x-ray of the left ankle as being negative for fracture  I did explain the x-ray findings to the patient.  He is placed in a cam boot.  He is to elevate and ice.  Follow-up with orthopedics in 1 week as due to the amount of bruising to have concerns for ligament type injury.  Comfort measures discussed.  He agrees with treatment plan.  Discharged stable condition.      FINAL CLINICAL IMPRESSION(S) / ED DIAGNOSES   Final diagnoses:  Acute left ankle pain     Rx / DC Orders   ED Discharge Orders     None        Note:  This document was prepared using Dragon voice recognition  software and may include unintentional dictation errors.    Faythe Ghee, PA-C 06/29/22 1454    Pilar Jarvis, MD 06/29/22 [phone]

## 2022-07-15 ENCOUNTER — Emergency Department (HOSPITAL_COMMUNITY)
Admission: EM | Admit: 2022-07-15 | Discharge: 2022-07-15 | Disposition: A | Discharge status: Home / Self Care | Payer: Medicare Other | Attending: Emergency Medicine | Admitting: Emergency Medicine

## 2022-07-15 ENCOUNTER — Emergency Department (HOSPITAL_COMMUNITY): Payer: Medicare Other

## 2022-07-15 ENCOUNTER — Encounter (HOSPITAL_COMMUNITY): Payer: Self-pay

## 2022-07-15 DIAGNOSIS — R197 Diarrhea, unspecified: Secondary | ICD-10-CM | POA: Insufficient documentation

## 2022-07-15 DIAGNOSIS — R1110 Vomiting, unspecified: Secondary | ICD-10-CM | POA: Diagnosis not present

## 2022-07-15 DIAGNOSIS — Z7982 Long term (current) use of aspirin: Secondary | ICD-10-CM | POA: Insufficient documentation

## 2022-07-15 DIAGNOSIS — R1030 Lower abdominal pain, unspecified: Secondary | ICD-10-CM | POA: Insufficient documentation

## 2022-07-15 LAB — LIPASE, BLOOD: Lipase: 30 U/L (ref 11–51)

## 2022-07-15 LAB — COMPREHENSIVE METABOLIC PANEL
ALT: 73 U/L — ABNORMAL HIGH (ref 0–44)
AST: 89 U/L — ABNORMAL HIGH (ref 15–41)
Albumin: 3.9 g/dL (ref 3.5–5.0)
Alkaline Phosphatase: 84 U/L (ref 38–126)
Anion gap: 11 (ref 5–15)
BUN: 14 mg/dL (ref 8–23)
CO2: 22 mmol/L (ref 22–32)
Calcium: 8.8 mg/dL — ABNORMAL LOW (ref 8.9–10.3)
Chloride: 104 mmol/L (ref 98–111)
Creatinine, Ser: 0.83 mg/dL (ref 0.61–1.24)
GFR, Estimated: >60 mL/min (ref >60)
Glucose, Bld: 124 mg/dL — ABNORMAL HIGH (ref 70–99)
Potassium: 4.2 mmol/L (ref 3.5–5.1)
Sodium: 137 mmol/L (ref 135–145)
Total Bilirubin: 1 mg/dL (ref 0.3–1.2)
Total Protein: 7 g/dL (ref 6.5–8.1)

## 2022-07-15 LAB — CBC
HCT: 39.9 % (ref 39.0–52.0)
Hemoglobin: 13.9 g/dL (ref 13.0–17.0)
MCH: 32.3 pg (ref 26.0–34.0)
MCHC: 34.8 g/dL (ref 30.0–36.0)
MCV: 92.8 fL (ref 80.0–100.0)
Platelets: 248 10*3/uL (ref 150–400)
RBC: 4.3 MIL/uL (ref 4.22–5.81)
RDW: 13.5 % (ref 11.5–15.5)
WBC: 10 10*3/uL (ref 4.0–10.5)
nRBC: 0 % (ref 0.0–0.2)

## 2022-07-15 LAB — URINALYSIS, ROUTINE W REFLEX MICROSCOPIC
Bilirubin Urine: NEGATIVE
Glucose, UA: NEGATIVE mg/dL
Hgb urine dipstick: NEGATIVE
Ketones, ur: NEGATIVE mg/dL
Leukocytes,Ua: NEGATIVE
Nitrite: NEGATIVE
Protein, ur: NEGATIVE mg/dL
Specific Gravity, Urine: 1.02 (ref 1.005–1.030)
pH: 5 (ref 5.0–8.0)

## 2022-07-15 NOTE — ED Triage Notes (Signed)
Pt states that he has been having bloating and diarrhea x 2 months. Pt states that this occurs once a week, with a dull lower abd pain. Pt states today he had a sharp pain that woke him up in his sleep.  Pt states his urine stream has been weak, wife adds that he has been going frequently.

## 2022-07-15 NOTE — ED Provider Notes (Signed)
[place] EMERGENCY DEPARTMENT AT [facility] Provider Note   CSN: [id number] Arrival date & time: 07/15/22  1739     History  Chief Complaint  Patient presents with   Abdominal Pain    Matthew Phelps is a 77 y.o. male.  HPI 77 year old male presents with lower abdominal pain.  He has been having abdominal pain for about 2 months.  He will have on and off pain as well as on and off diarrhea.  He will have a couple days of diarrhea, will stop, not have any bowel movements, and then have diarrhea a few days later.  No blood in the stool, fever, chest pain, shortness of breath.  He has had some intermittent vomiting as well.  This morning when he woke up the pain was at its worst though currently when I am seeing him the pain is gone.  Home Medications Prior to Admission medications   Medication Sig Start Date End Date Taking? Authorizing Provider  albuterol (VENTOLIN HFA) 108 (90 Base) MCG/ACT inhaler SMARTSIG:2 Puff(s) By Mouth Every 4-6 Hours PRN 03/18/21   [provider]  aspirin 325 MG tablet Take 1 tablet (325 mg total) by mouth daily. 05/08/20   Almon Hercules, MD  atorvastatin (LIPITOR) 40 MG tablet TAKE 1 TABLET(40 MG) BY MOUTH DAILY 08/09/21   Hilty, Lisette Abu, MD  cholecalciferol (VITAMIN D3) 25 MCG (1000 UNIT) tablet Take 1,000 Units by mouth daily.    [provider]  Evolocumab (REPATHA SURECLICK) 140 MG/ML SOAJ Inject 140 mg into the skin every 14 (fourteen) days. <PLEASE MAKE APPOINTMENT FOR REFILLS> 04/03/22   Hilty, Lisette Abu, MD  Multiple Vitamins-Minerals (MULTIVITAMIN ADULTS 50+) TABS Take 1 tablet by mouth daily.    [provider]  vitamin C (ASCORBIC ACID) 500 MG tablet Take 500 mg by mouth daily.    [provider]      Allergies    Contrast media [iodinated contrast media], Sulfa antibiotics, and Tetracyclines & related    Review of Systems   Review of Systems  Constitutional:  Negative for fever.   Respiratory:  Negative for shortness of breath.   Gastrointestinal:  Positive for abdominal pain, diarrhea and vomiting.    Physical Exam Updated Vital Signs BP 111/77 (BP Location: Left Arm)   Pulse 61   Temp 97.9 F (36.6 C) (Oral)   Resp 17   SpO2 97%  Physical Exam Vitals and nursing note reviewed.  Constitutional:      Appearance: He is well-developed.  HENT:     Head: Normocephalic and atraumatic.  Cardiovascular:     Rate and Rhythm: Normal rate and regular rhythm.     Heart sounds: Normal heart sounds.  Pulmonary:     Effort: Pulmonary effort is normal.     Breath sounds: Normal breath sounds.  Abdominal:     Palpations: Abdomen is soft.     Tenderness: There is no abdominal tenderness.    Skin:    General: Skin is warm and dry.  Neurological:     Mental Status: He is alert.     ED Results / Procedures / Treatments   Labs (all labs ordered are listed, but only abnormal results are displayed) Labs Reviewed  COMPREHENSIVE METABOLIC PANEL - Abnormal; Notable for the following components:      Result Value   Glucose, Bld 124 (*)    Calcium 8.8 (*)    AST 89 (*)    ALT 73 (*)  All other components within normal limits  LIPASE, BLOOD  CBC  URINALYSIS, ROUTINE W REFLEX MICROSCOPIC    EKG None  Radiology CT ABDOMEN PELVIS WO CONTRAST  Result Date: 07/15/2022 CLINICAL DATA:  Acute abdominal pain EXAM: CT ABDOMEN AND PELVIS WITHOUT CONTRAST TECHNIQUE: Multidetector CT imaging of the abdomen and pelvis was performed following the standard protocol without IV contrast. RADIATION DOSE REDUCTION: This exam was performed according to the departmental dose-optimization program which includes automated exposure control, adjustment of the mA and/or kV according to patient size and/or use of iterative reconstruction technique. COMPARISON:  None Available. FINDINGS: Lower chest: There is some atelectasis in the lung bases. Hepatobiliary: Gallstones are present. No  focal liver lesions. No biliary ductal dilatation. Pancreas: Unremarkable. No pancreatic ductal dilatation or surrounding inflammatory changes. Spleen: Normal in size without focal abnormality. Adrenals/Urinary Tract: There is a 7 mm calculus in the left kidney. There is no hydronephrosis or perinephric fat stranding. There are left renal cortical rounded hypodensities measuring up to 15 mm favored as cysts. There is a single cortical cyst in the right kidney measuring less than 1 cm. The adrenal glands are within normal limits. There is mild inflammatory stranding surrounding the bladder. Stomach/Bowel: Stomach is within normal limits. Appendix appears normal. No evidence of bowel wall thickening, distention, or inflammatory changes. Vascular/Lymphatic: No significant vascular findings are present. No enlarged abdominal or pelvic lymph nodes. Reproductive: Prostate gland not seen. Other: Very small fat containing umbilical hernia.  No ascites. Musculoskeletal: Degenerative changes affect the spine. IMPRESSION: 1. Mild inflammatory stranding surrounding the bladder worrisome for cystitis. 2. Nonobstructing left renal calculus. 3. Cholelithiasis. Electronically Signed   By: Darliss Cheney M.D.   On: 07/15/2022 22:26    Procedures Procedures    Medications Ordered in ED Medications - No data to display  ED Course/ Medical Decision Making/ A&P                             Medical Decision Making Amount and/or Complexity of Data Reviewed Labs: ordered.    Details: Mild LFT abnormality, stable.  Normal WBC and hemoglobin.  Clear urine. Radiology: ordered and independent interpretation performed.    Details: No SBO or obvious mass   Patient presents with 2 months of lower abdominal pain and GI symptoms.  He has chronic urinary symptoms due to previous prostate surgery but states that he is not having any new symptoms.  No dysuria, frequency, urgency, etc. that is not baseline.  Discussed the potential  stranding around the bladder but the also clear urine.  After discussion with patient and wife, will hold off on antibiotics as he is not having any specific symptoms and seems unlikely he has a UTI.  As far as his GI symptoms, no clear cause.  No abdominal pain in the ER.  Will refer to gastroenterology for further evaluation and outpatient workup.  Will give return precautions.        Final Clinical Impression(s) / ED Diagnoses Final diagnoses:  Lower abdominal pain    Rx / DC Orders ED Discharge Orders          Ordered    Ambulatory referral to Gastroenterology        07/15/22 2241              Pricilla Loveless, MD 07/15/22 2251

## 2022-07-15 NOTE — Discharge Instructions (Signed)
If you develop worsening, continued, or recurrent abdominal pain, uncontrolled vomiting, fever, chest or back pain, urinary symptoms or any other new/concerning symptoms then return to the ER for evaluation.

## 2022-08-05 ENCOUNTER — Other Ambulatory Visit: Payer: Self-pay | Admitting: Internal Medicine

## 2022-08-05 DIAGNOSIS — E785 Hyperlipidemia, unspecified: Secondary | ICD-10-CM

## 2022-08-05 DIAGNOSIS — Z8673 Personal history of transient ischemic attack (TIA), and cerebral infarction without residual deficits: Secondary | ICD-10-CM

## 2022-09-04 ENCOUNTER — Other Ambulatory Visit: Payer: Self-pay | Admitting: Internal Medicine

## 2022-09-04 DIAGNOSIS — E785 Hyperlipidemia, unspecified: Secondary | ICD-10-CM

## 2022-09-04 DIAGNOSIS — Z8673 Personal history of transient ischemic attack (TIA), and cerebral infarction without residual deficits: Secondary | ICD-10-CM

## 2022-09-12 ENCOUNTER — Ambulatory Visit: Payer: Medicare Other | Admitting: Physician Assistant

## 2022-10-01 ENCOUNTER — Other Ambulatory Visit (HOSPITAL_BASED_OUTPATIENT_CLINIC_OR_DEPARTMENT_OTHER): Payer: Self-pay | Admitting: Internal Medicine

## 2022-10-01 DIAGNOSIS — Z8673 Personal history of transient ischemic attack (TIA), and cerebral infarction without residual deficits: Secondary | ICD-10-CM

## 2022-10-01 DIAGNOSIS — E785 Hyperlipidemia, unspecified: Secondary | ICD-10-CM

## 2022-10-01 NOTE — Telephone Encounter (Signed)
Patient will call to schedule for refills

## 2022-10-02 ENCOUNTER — Other Ambulatory Visit: Payer: Self-pay | Admitting: Pharmacist

## 2022-10-02 DIAGNOSIS — E785 Hyperlipidemia, unspecified: Secondary | ICD-10-CM

## 2022-10-02 DIAGNOSIS — Z8673 Personal history of transient ischemic attack (TIA), and cerebral infarction without residual deficits: Secondary | ICD-10-CM

## 2022-10-02 MED ORDER — REPATHA SURECLICK 140 MG/ML ~~LOC~~ SOAJ
140.0000 mg | SUBCUTANEOUS | 0 refills | Status: DC
Start: 2022-10-02 — End: 2023-02-06

## 2022-10-05 ENCOUNTER — Other Ambulatory Visit: Payer: Self-pay | Admitting: Internal Medicine

## 2022-10-05 DIAGNOSIS — Z8673 Personal history of transient ischemic attack (TIA), and cerebral infarction without residual deficits: Secondary | ICD-10-CM

## 2022-10-05 DIAGNOSIS — E785 Hyperlipidemia, unspecified: Secondary | ICD-10-CM

## 2022-12-10 ENCOUNTER — Other Ambulatory Visit (HOSPITAL_COMMUNITY): Payer: Self-pay

## 2022-12-10 ENCOUNTER — Telehealth: Payer: Self-pay | Admitting: Pharmacy Technician

## 2022-12-10 NOTE — Telephone Encounter (Signed)
Pharmacy Patient Advocate Encounter   Received notification from CoverMyMeds that prior authorization for repatha is required/requested.   Insurance verification completed.   The patient is insured through [facility] .   Per test claim: PA required; PA started via CoverMyMeds. KEY [id number] . Waiting for clinical questions to populate.

## 2022-12-10 NOTE — Telephone Encounter (Signed)
Pharmacy Patient Advocate Encounter  Received notification from [facility] that Prior Authorization for repatha has been APPROVED from 12/10/22 to 12/10/23. Ran test claim, Copay is $45.00. This test claim was processed through [facility]- copay amounts may vary at other pharmacies due to pharmacy/plan contracts, or as the patient moves through the different stages of their insurance plan.   PA #/Case ID/Reference #: [id number]

## 2022-12-26 ENCOUNTER — Ambulatory Visit (HOSPITAL_BASED_OUTPATIENT_CLINIC_OR_DEPARTMENT_OTHER): Payer: Medicare Other | Admitting: Nurse Practitioner

## 2022-12-26 ENCOUNTER — Encounter (HOSPITAL_BASED_OUTPATIENT_CLINIC_OR_DEPARTMENT_OTHER): Payer: Self-pay | Admitting: Nurse Practitioner

## 2022-12-26 VITALS — BP 124/78 | HR 77 | Ht 76.0 in | Wt 207.7 lb

## 2022-12-26 DIAGNOSIS — R002 Palpitations: Secondary | ICD-10-CM

## 2022-12-26 DIAGNOSIS — E785 Hyperlipidemia, unspecified: Secondary | ICD-10-CM

## 2022-12-26 DIAGNOSIS — R0602 Shortness of breath: Secondary | ICD-10-CM

## 2022-12-26 DIAGNOSIS — Z8673 Personal history of transient ischemic attack (TIA), and cerebral infarction without residual deficits: Secondary | ICD-10-CM

## 2022-12-26 NOTE — Patient Instructions (Signed)
Medication Instructions:    CHANGE Asprin one (1) tablet by mouth ( 81 mg) daily.   *If you need a refill on your cardiac medications before your next appointment, please call your pharmacy*   Lab Work:  None ordered.  If you have labs (blood work) drawn today and your tests are completely normal, you will receive your results only by: MyChart Message (if you have MyChart) OR A paper copy in the mail If you have any lab test that is abnormal or we need to change your treatment, we will call you to review the results.   Testing/Procedures:  Your physician has requested that you have an echocardiogram. Echocardiography is a painless test that uses sound waves to create images of your heart. It provides your doctor with information about the size and shape of your heart and how well your heart's chambers and valves are working. This procedure takes approximately one hour. There are no restrictions for this procedure. Please do NOT wear cologne, aftershave, or lotions (deodorant is allowed). Please arrive 15 minutes prior to your appointment time.    Follow-Up: At [facility], you and your health needs are our priority.  As part of our continuing mission to provide you with exceptional heart care, we have created designated Provider Care Teams.  These Care Teams include your primary Cardiologist (physician) and Advanced Practice Providers (APPs -  Physician Assistants and Nurse Practitioners) who all work together to provide you with the care you need, when you need it.  We recommend signing up for the patient portal called "MyChart".  Sign up information is provided on this After Visit Summary.  MyChart is used to connect with patients for Virtual Visits (Telemedicine).  Patients are able to view lab/test results, encounter notes, upcoming appointments, etc.  Non-urgent messages can be sent to your provider as well.   To learn more about what you can do with MyChart, go to  [email or web address].    Your next appointment:   1 year(s)  Provider:   K. Italy Hilty, MD or Lebron Conners     Other Instructions  Your physician wants you to follow-up in: 1 year.  You will receive a reminder letter in the mail two months in advance. If you don't receive a letter, please call our office to schedule the follow-up appointment.

## 2022-12-26 NOTE — Progress Notes (Signed)
Cardiology Office Note:  .   Date:  12/26/2022  ID:  TURON KILMER, DOB 1945-12-23, MRN [id number] PCP: Dorothey Baseman, MD  [facility] Providers Cardiologist:  None    Patient Profile: .      PMH Dyslipidemia TIA/possible stroke  Referred to advanced lipid clinic and seen by Dr. Rennis Golden 08/01/2020.  He has a history of TIA/possible stroke.  No clear imaging evidence of stroke, however has been seen by Dr. Marjory Lies with neurology. He had MRI and CT of the brain.  He also wore a monitor which demonstrated no evidence of atrial fibrillation.  He says he does have a history of atrial fibrillation, however it was more than 20 years ago.  He had a couple of episodes and was possibly on an antiarrhythmic medication as well as warfarin for about 1 month and then it was discontinued.  He had recent lipids that revealed LDL cholesterol greater than 190.  He was started on atorvastatin 20 mg daily and previously had been on 40 mg of simvastatin.  Repeat lipids a month later showed total cholesterol 224, triglycerides 233, HDL 32, and LDL 146.  He denies recurrent palpitations.  Dr. Rennis Golden increased atorvastatin to 40 mg daily.  At follow-up 10/28/2020 lipids not much improved.  Total cholesterol 217, triglycerides 195, HDL 36, and LDL 142.  Goal LDL less than 70 based on prior TIA.    He was started on Repatha with significant improvement at follow-up 04/04/2021 with total cholesterol 106, HDL 31, triglycerides 200 and LDL 35.  LP(a) was negative.       History of Present Illness: .   JAQUAVIOUS MERCER is a very pleasant 77 y.o. male who is here for follow-up. His cholesterol is well-managed with a recent LDL cholesterol level of 29. Recent sepsis infection while traveling in [place] for a family event. He woke up with a sore throat and later became so weak he could not walk. He was taken to the hospital and diagnosed with a strep that had gone into sepsis. Was hospitalized for several days.  Has occasional palpitations. History of a fib 20-30 years ago. No evidence of a fib during hospitalization.  He uses a cane due to neuropathy. History of pneumonia which left a scar on the lung. He reports  occasional shortness of breath but does not engage in strenuous physical activity. He denies chest pain, orthopnea, PND, edema, presyncope or syncope. Has taken either 325 mg or 81 mg aspirin daily - takes one or the other with no pattern, just has both bottles at home.   ROS: See HPI       Studies Reviewed: .        Risk Assessment/Calculations:             Physical Exam:   VS:  BP 124/78   Pulse 77   Ht 6\' 4"  (1.93 m)   Wt 207 lb 11.2 oz (94.2 kg)   SpO2 97%   BMI 25.28 kg/m    Wt Readings from Last 3 Encounters:  12/26/22 207 lb 11.2 oz (94.2 kg)  06/29/22 200 lb (90.7 kg)  04/04/21 204 lb 8 oz (92.8 kg)    GEN: Well nourished, well developed in no acute distress NECK: No JVD; No carotid bruits CARDIAC: RRR, no murmurs, rubs, gallops RESPIRATORY:  Clear to auscultation without rales, wheezing or rhonchi  ABDOMEN: Soft, non-tender, non-distended EXTREMITIES:  No edema; No deformity     ASSESSMENT AND PLAN: .  Dyslipidemia LDL goal < 70: LDL cholesterol is well controlled at 29. He is tolerating Repatha and atorvastatin with no reported side effects, we will continue these medications.   Shortness of breath: Increased shortness of breath recently. No edema, orthopnea, or PND. No obvious weight gain.  He appears euvolemic on exam.  Last echocardiogram 04/2020 revealed mild LVH, grade 1 diastolic dysfunction, normal LV function, and no significant valve disease.  We will repeat echocardiogram to assess heart function and evaluate for structural heart disease.  TIA: No recent episodes of TIA reported. Patient reports occasional palpitations and a history of atrial fibrillation. Reduce aspirin dose to 81mg  daily for stroke prevention.   Palpitations: Occasional palpitations.  No evidence of a fib during lengthy hospitalization for sepsis. If palpitations worsen, consider repeat cardiac monitoring.       Dispo: 1 year with Dr. Rennis Golden or me  Signed, Eligha Bridegroom, NP-C

## 2023-01-17 ENCOUNTER — Ambulatory Visit (HOSPITAL_COMMUNITY): Payer: Medicare Other | Attending: Internal Medicine

## 2023-01-17 DIAGNOSIS — E785 Hyperlipidemia, unspecified: Secondary | ICD-10-CM | POA: Diagnosis present

## 2023-01-17 DIAGNOSIS — R0602 Shortness of breath: Secondary | ICD-10-CM | POA: Diagnosis present

## 2023-01-17 LAB — ECHOCARDIOGRAM COMPLETE
Area-P 1/2: 3.46 cm2
S' Lateral: 3 cm

## 2023-02-06 ENCOUNTER — Telehealth: Payer: Self-pay | Admitting: Internal Medicine

## 2023-02-06 DIAGNOSIS — Z8673 Personal history of transient ischemic attack (TIA), and cerebral infarction without residual deficits: Secondary | ICD-10-CM

## 2023-02-06 DIAGNOSIS — E785 Hyperlipidemia, unspecified: Secondary | ICD-10-CM

## 2023-02-06 MED ORDER — REPATHA SURECLICK 140 MG/ML ~~LOC~~ SOAJ
140.0000 mg | SUBCUTANEOUS | 0 refills | Status: DC
Start: 2023-02-06 — End: 2023-04-04

## 2023-02-06 NOTE — Telephone Encounter (Signed)
*  STAT* If patient is at the pharmacy, call can be transferred to refill team.   1. Which medications need to be refilled? (please list name of each medication and dose if known)   Evolocumab (REPATHA SURECLICK) 140 MG/ML SOAJ     2. Would you like to learn more about the convenience, safety, & potential cost savings by using the [facility]? No   3. Are you open to using the [facility] (Type [facility]. ) No   4. Which pharmacy/location (including street and city if local pharmacy) is medication to be sent to?  [id number] - [place], [place] - [address]     5. Do they need a 30 day or 90 day supply? 30 day

## 2023-04-04 ENCOUNTER — Other Ambulatory Visit: Payer: Self-pay | Admitting: Internal Medicine

## 2023-04-04 DIAGNOSIS — Z8673 Personal history of transient ischemic attack (TIA), and cerebral infarction without residual deficits: Secondary | ICD-10-CM

## 2023-04-04 DIAGNOSIS — E785 Hyperlipidemia, unspecified: Secondary | ICD-10-CM

## 2023-04-15 LAB — COLOGUARD: COLOGUARD: NEGATIVE

## 2023-05-29 ENCOUNTER — Other Ambulatory Visit: Payer: Self-pay | Admitting: Nurse Practitioner

## 2023-05-29 DIAGNOSIS — Z8673 Personal history of transient ischemic attack (TIA), and cerebral infarction without residual deficits: Secondary | ICD-10-CM

## 2023-05-29 DIAGNOSIS — E785 Hyperlipidemia, unspecified: Secondary | ICD-10-CM

## 2023-09-08 ENCOUNTER — Other Ambulatory Visit: Payer: Self-pay | Admitting: Nurse Practitioner

## 2023-09-08 DIAGNOSIS — Z8673 Personal history of transient ischemic attack (TIA), and cerebral infarction without residual deficits: Secondary | ICD-10-CM

## 2023-09-08 DIAGNOSIS — E785 Hyperlipidemia, unspecified: Secondary | ICD-10-CM

## 2023-12-09 ENCOUNTER — Other Ambulatory Visit: Payer: Self-pay | Admitting: Internal Medicine

## 2024-01-07 ENCOUNTER — Telehealth: Payer: Self-pay | Admitting: Pharmacy Technician

## 2024-01-07 ENCOUNTER — Other Ambulatory Visit (HOSPITAL_COMMUNITY): Payer: Self-pay

## 2024-01-07 NOTE — Telephone Encounter (Signed)
 Caller Susette) called to report patient's prior authorization for Evolocumab  (REPATHA  SURECLICK) 140 MG/ML SOAJ has been approved.  Approval is effective through 01/06/25.  Case# [id number].  Caller stated she will be faxing over written confirmation.

## 2024-01-07 NOTE — Telephone Encounter (Signed)
   Pharmacy Patient Advocate Encounter   Received notification from Onbase that prior authorization for repatha  is required/requested.   Insurance verification completed.   The patient is insured through [facility]   Per test claim: PA required; PA submitted to above mentioned insurance via Latent Key/confirmation #/EOC [id number] Status is pending

## 2024-01-08 NOTE — Telephone Encounter (Signed)
 Pharmacy Patient Advocate Encounter  Received notification from [facility] that Prior Authorization for repatha  has been APPROVED from 01/07/24 to 01/06/25   PA #/Case ID/Reference #: [id number]
